# Patient Record
Sex: Female | Born: 1935 | Race: White | Hispanic: No | Marital: Married
Health system: Southern US, Community
[De-identification: ages and names within clinical notes are randomized; demographics above are authoritative.]

## PROBLEM LIST (undated history)

## (undated) HISTORY — PX: BREAST EXCISIONAL BIOPSY: SUR124

## (undated) HISTORY — PX: BREAST LUMPECTOMY: SHX2

---

## 2017-03-06 ENCOUNTER — Other Ambulatory Visit: Payer: Self-pay | Admitting: Family Medicine

## 2017-03-06 DIAGNOSIS — Z139 Encounter for screening, unspecified: Secondary | ICD-10-CM

## 2017-04-06 ENCOUNTER — Ambulatory Visit
Admission: RE | Admit: 2017-04-06 | Discharge: 2017-04-06 | Disposition: A | Discharge status: Home / Self Care | Payer: Medicare Other | Source: Ambulatory Visit | Attending: Family Medicine | Admitting: Family Medicine

## 2017-04-06 ENCOUNTER — Encounter: Payer: Self-pay | Admitting: Radiology

## 2017-04-06 DIAGNOSIS — Z139 Encounter for screening, unspecified: Secondary | ICD-10-CM

## 2017-10-05 ENCOUNTER — Emergency Department (HOSPITAL_COMMUNITY): Payer: Medicare Other | Admitting: Certified Registered Nurse Anesthetist

## 2017-10-05 ENCOUNTER — Encounter (HOSPITAL_COMMUNITY): Payer: Self-pay | Admitting: Neurology

## 2017-10-05 ENCOUNTER — Inpatient Hospital Stay (HOSPITAL_COMMUNITY)
Admission: EM | Admit: 2017-10-05 | Discharge: 2017-10-22 | DRG: 023 | Disposition: E | Payer: Medicare Other | Attending: Neurology | Admitting: Neurology

## 2017-10-05 ENCOUNTER — Emergency Department (HOSPITAL_COMMUNITY): Payer: Medicare Other

## 2017-10-05 ENCOUNTER — Encounter (HOSPITAL_COMMUNITY): Admission: EM | Disposition: E | Payer: Self-pay | Source: Home / Self Care | Attending: Neurology

## 2017-10-05 DIAGNOSIS — I63411 Cerebral infarction due to embolism of right middle cerebral artery: Secondary | ICD-10-CM | POA: Diagnosis present

## 2017-10-05 DIAGNOSIS — R680 Hypothermia, not associated with low environmental temperature: Secondary | ICD-10-CM | POA: Diagnosis not present

## 2017-10-05 DIAGNOSIS — G8194 Hemiplegia, unspecified affecting left nondominant side: Secondary | ICD-10-CM | POA: Diagnosis present

## 2017-10-05 DIAGNOSIS — R29718 NIHSS score 18: Secondary | ICD-10-CM | POA: Diagnosis present

## 2017-10-05 DIAGNOSIS — I6389 Other cerebral infarction: Secondary | ICD-10-CM | POA: Diagnosis not present

## 2017-10-05 DIAGNOSIS — R001 Bradycardia, unspecified: Secondary | ICD-10-CM | POA: Diagnosis not present

## 2017-10-05 DIAGNOSIS — F419 Anxiety disorder, unspecified: Secondary | ICD-10-CM | POA: Diagnosis present

## 2017-10-05 DIAGNOSIS — Z8673 Personal history of transient ischemic attack (TIA), and cerebral infarction without residual deficits: Secondary | ICD-10-CM

## 2017-10-05 DIAGNOSIS — J9601 Acute respiratory failure with hypoxia: Secondary | ICD-10-CM | POA: Diagnosis not present

## 2017-10-05 DIAGNOSIS — Z7189 Other specified counseling: Secondary | ICD-10-CM | POA: Diagnosis not present

## 2017-10-05 DIAGNOSIS — R739 Hyperglycemia, unspecified: Secondary | ICD-10-CM | POA: Diagnosis present

## 2017-10-05 DIAGNOSIS — Z66 Do not resuscitate: Secondary | ICD-10-CM | POA: Diagnosis not present

## 2017-10-05 DIAGNOSIS — I959 Hypotension, unspecified: Secondary | ICD-10-CM | POA: Diagnosis not present

## 2017-10-05 DIAGNOSIS — Z823 Family history of stroke: Secondary | ICD-10-CM | POA: Diagnosis not present

## 2017-10-05 DIAGNOSIS — R0902 Hypoxemia: Secondary | ICD-10-CM | POA: Diagnosis present

## 2017-10-05 DIAGNOSIS — I63432 Cerebral infarction due to embolism of left posterior cerebral artery: Secondary | ICD-10-CM | POA: Diagnosis present

## 2017-10-05 DIAGNOSIS — R0603 Acute respiratory distress: Secondary | ICD-10-CM | POA: Diagnosis present

## 2017-10-05 DIAGNOSIS — I6601 Occlusion and stenosis of right middle cerebral artery: Secondary | ICD-10-CM | POA: Diagnosis not present

## 2017-10-05 DIAGNOSIS — E876 Hypokalemia: Secondary | ICD-10-CM | POA: Diagnosis present

## 2017-10-05 DIAGNOSIS — Z6828 Body mass index (BMI) 28.0-28.9, adult: Secondary | ICD-10-CM

## 2017-10-05 DIAGNOSIS — R29810 Facial weakness: Secondary | ICD-10-CM | POA: Diagnosis present

## 2017-10-05 DIAGNOSIS — E871 Hypo-osmolality and hyponatremia: Secondary | ICD-10-CM | POA: Diagnosis present

## 2017-10-05 DIAGNOSIS — E785 Hyperlipidemia, unspecified: Secondary | ICD-10-CM | POA: Diagnosis present

## 2017-10-05 DIAGNOSIS — Z923 Personal history of irradiation: Secondary | ICD-10-CM | POA: Diagnosis not present

## 2017-10-05 DIAGNOSIS — I10 Essential (primary) hypertension: Secondary | ICD-10-CM | POA: Diagnosis present

## 2017-10-05 DIAGNOSIS — G936 Cerebral edema: Secondary | ICD-10-CM | POA: Diagnosis present

## 2017-10-05 DIAGNOSIS — Z853 Personal history of malignant neoplasm of breast: Secondary | ICD-10-CM

## 2017-10-05 DIAGNOSIS — I6521 Occlusion and stenosis of right carotid artery: Secondary | ICD-10-CM | POA: Diagnosis not present

## 2017-10-05 DIAGNOSIS — Z515 Encounter for palliative care: Secondary | ICD-10-CM | POA: Diagnosis not present

## 2017-10-05 DIAGNOSIS — D72829 Elevated white blood cell count, unspecified: Secondary | ICD-10-CM | POA: Diagnosis present

## 2017-10-05 DIAGNOSIS — E669 Obesity, unspecified: Secondary | ICD-10-CM | POA: Diagnosis present

## 2017-10-05 DIAGNOSIS — I340 Nonrheumatic mitral (valve) insufficiency: Secondary | ICD-10-CM | POA: Diagnosis not present

## 2017-10-05 DIAGNOSIS — G92 Toxic encephalopathy: Secondary | ICD-10-CM | POA: Diagnosis present

## 2017-10-05 DIAGNOSIS — Z79899 Other long term (current) drug therapy: Secondary | ICD-10-CM

## 2017-10-05 DIAGNOSIS — R1310 Dysphagia, unspecified: Secondary | ICD-10-CM | POA: Diagnosis present

## 2017-10-05 DIAGNOSIS — I639 Cerebral infarction, unspecified: Secondary | ICD-10-CM

## 2017-10-05 DIAGNOSIS — I952 Hypotension due to drugs: Secondary | ICD-10-CM | POA: Diagnosis not present

## 2017-10-05 DIAGNOSIS — Z7982 Long term (current) use of aspirin: Secondary | ICD-10-CM

## 2017-10-05 DIAGNOSIS — J9602 Acute respiratory failure with hypercapnia: Secondary | ICD-10-CM | POA: Diagnosis not present

## 2017-10-05 DIAGNOSIS — I361 Nonrheumatic tricuspid (valve) insufficiency: Secondary | ICD-10-CM | POA: Diagnosis not present

## 2017-10-05 DIAGNOSIS — Z9289 Personal history of other medical treatment: Secondary | ICD-10-CM

## 2017-10-05 DIAGNOSIS — J9690 Respiratory failure, unspecified, unspecified whether with hypoxia or hypercapnia: Secondary | ICD-10-CM | POA: Diagnosis not present

## 2017-10-05 HISTORY — PX: IR CT HEAD LTD: IMG2386

## 2017-10-05 HISTORY — PX: IR PERCUTANEOUS ART THROMBECTOMY/INFUSION INTRACRANIAL INC DIAG ANGIO: IMG6087

## 2017-10-05 HISTORY — PX: RADIOLOGY WITH ANESTHESIA: SHX6223

## 2017-10-05 LAB — I-STAT TROPONIN, ED: TROPONIN I, POC: 0.01 ng/mL (ref 0.00–0.08)

## 2017-10-05 LAB — CBC
HCT: 39.2 % (ref 36.0–46.0)
Hemoglobin: 12.9 g/dL (ref 12.0–15.0)
MCH: 22.2 pg — ABNORMAL LOW (ref 26.0–34.0)
MCHC: 32.9 g/dL (ref 30.0–36.0)
MCV: 67.4 fL — AB (ref 78.0–100.0)
PLATELETS: 365 10*3/uL (ref 150–400)
RBC: 5.82 MIL/uL — ABNORMAL HIGH (ref 3.87–5.11)
RDW: 21.4 % — AB (ref 11.5–15.5)
WBC: 14.5 10*3/uL — AB (ref 4.0–10.5)

## 2017-10-05 LAB — POCT I-STAT 3, ART BLOOD GAS (G3+)
Acid-base deficit: 3 mmol/L — ABNORMAL HIGH (ref 0.0–2.0)
Bicarbonate: 22.9 mmol/L (ref 20.0–28.0)
O2 Saturation: 98 %
PCO2 ART: 37.9 mmHg (ref 32.0–48.0)
TCO2: 24 mmol/L (ref 22–32)
pH, Arterial: 7.377 (ref 7.350–7.450)
pO2, Arterial: 91 mmHg (ref 83.0–108.0)

## 2017-10-05 LAB — DIFFERENTIAL
Basophils Absolute: 0 10*3/uL (ref 0.0–0.1)
Basophils Relative: 0 %
EOS ABS: 0 10*3/uL (ref 0.0–0.7)
Eosinophils Relative: 0 %
LYMPHS ABS: 1.3 10*3/uL (ref 0.7–4.0)
Lymphocytes Relative: 9 %
MONO ABS: 1 10*3/uL (ref 0.1–1.0)
Monocytes Relative: 7 %
Neutro Abs: 12.2 10*3/uL — ABNORMAL HIGH (ref 1.7–7.7)
Neutrophils Relative %: 84 %

## 2017-10-05 LAB — PROTIME-INR
INR: 1.04
PROTHROMBIN TIME: 13.5 s (ref 11.4–15.2)

## 2017-10-05 LAB — RENAL FUNCTION PANEL
ALBUMIN: 2.8 g/dL — AB (ref 3.5–5.0)
ANION GAP: 9 (ref 5–15)
BUN: 11 mg/dL (ref 8–23)
CALCIUM: 7 mg/dL — AB (ref 8.9–10.3)
CO2: 23 mmol/L (ref 22–32)
CREATININE: 0.53 mg/dL (ref 0.44–1.00)
Chloride: 99 mmol/L (ref 98–111)
GFR calc Af Amer: >60 mL/min (ref >60)
GFR calc non Af Amer: >60 mL/min (ref >60)
GLUCOSE: 113 mg/dL — AB (ref 70–99)
PHOSPHORUS: 2.8 mg/dL (ref 2.5–4.6)
Potassium: 2.4 mmol/L — CL (ref 3.5–5.1)
SODIUM: 131 mmol/L — AB (ref 135–145)

## 2017-10-05 LAB — COMPREHENSIVE METABOLIC PANEL
ALT: 19 U/L (ref 0–44)
ANION GAP: 13 (ref 5–15)
AST: 23 U/L (ref 15–41)
Albumin: 4.2 g/dL (ref 3.5–5.0)
Alkaline Phosphatase: 62 U/L (ref 38–126)
BUN: 15 mg/dL (ref 8–23)
CHLORIDE: 91 mmol/L — AB (ref 98–111)
CO2: 25 mmol/L (ref 22–32)
Calcium: 9.5 mg/dL (ref 8.9–10.3)
Creatinine, Ser: 0.64 mg/dL (ref 0.44–1.00)
GFR calc non Af Amer: >60 mL/min (ref >60)
Glucose, Bld: 125 mg/dL — ABNORMAL HIGH (ref 70–99)
POTASSIUM: 3 mmol/L — AB (ref 3.5–5.1)
Sodium: 129 mmol/L — ABNORMAL LOW (ref 135–145)
Total Bilirubin: 1.7 mg/dL — ABNORMAL HIGH (ref 0.3–1.2)
Total Protein: 7.6 g/dL (ref 6.5–8.1)

## 2017-10-05 LAB — PROCALCITONIN: Procalcitonin: <0.1 ng/mL

## 2017-10-05 LAB — I-STAT CHEM 8, ED
BUN: 16 mg/dL (ref 8–23)
CALCIUM ION: 1.03 mmol/L — AB (ref 1.15–1.40)
Chloride: 91 mmol/L — ABNORMAL LOW (ref 98–111)
Creatinine, Ser: 0.5 mg/dL (ref 0.44–1.00)
Glucose, Bld: 125 mg/dL — ABNORMAL HIGH (ref 70–99)
HCT: 45 % (ref 36.0–46.0)
HEMOGLOBIN: 15.3 g/dL — AB (ref 12.0–15.0)
POTASSIUM: 3 mmol/L — AB (ref 3.5–5.1)
SODIUM: 128 mmol/L — AB (ref 135–145)
TCO2: 27 mmol/L (ref 22–32)

## 2017-10-05 LAB — GLUCOSE, CAPILLARY
GLUCOSE-CAPILLARY: 106 mg/dL — AB (ref 70–99)
GLUCOSE-CAPILLARY: 88 mg/dL (ref 70–99)

## 2017-10-05 LAB — TSH: TSH: 3.614 u[IU]/mL (ref 0.350–4.500)

## 2017-10-05 LAB — CBG MONITORING, ED: GLUCOSE-CAPILLARY: 132 mg/dL — AB (ref 70–99)

## 2017-10-05 LAB — APTT: APTT: 31 s (ref 24–36)

## 2017-10-05 LAB — TRIGLYCERIDES: TRIGLYCERIDES: 74 mg/dL (ref <150)

## 2017-10-05 LAB — MAGNESIUM: Magnesium: 1.3 mg/dL — ABNORMAL LOW (ref 1.7–2.4)

## 2017-10-05 LAB — ETHANOL

## 2017-10-05 LAB — MRSA PCR SCREENING: MRSA BY PCR: NEGATIVE

## 2017-10-05 LAB — BRAIN NATRIURETIC PEPTIDE: B NATRIURETIC PEPTIDE 5: 186.4 pg/mL — AB (ref 0.0–100.0)

## 2017-10-05 SURGERY — IR WITH ANESTHESIA
Anesthesia: General

## 2017-10-05 MED ORDER — SODIUM CHLORIDE 0.9 % IV SOLN
0.0000 ug/min | INTRAVENOUS | Status: DC
  Administered 2017-10-05: 30 ug/min via INTRAVENOUS
  Filled 2017-10-05: qty 10

## 2017-10-05 MED ORDER — CHLORHEXIDINE GLUCONATE 0.12% ORAL RINSE (MEDLINE KIT)
15.0000 mL | Freq: Two times a day (BID) | OROMUCOSAL | Status: DC
  Administered 2017-10-05 – 2017-10-07 (×5): 15 mL via OROMUCOSAL

## 2017-10-05 MED ORDER — ETOMIDATE 2 MG/ML IV SOLN
INTRAVENOUS | Status: DC | PRN
  Administered 2017-10-05: 20 mg via INTRAVENOUS

## 2017-10-05 MED ORDER — ROCURONIUM BROMIDE 50 MG/5ML IV SOLN
INTRAVENOUS | Status: DC | PRN
  Administered 2017-10-05: 100 mg via INTRAVENOUS

## 2017-10-05 MED ORDER — FENTANYL CITRATE (PF) 100 MCG/2ML IJ SOLN
INTRAMUSCULAR | Status: DC | PRN
  Administered 2017-10-05: 50 ug via INTRAVENOUS

## 2017-10-05 MED ORDER — SODIUM CHLORIDE 0.9 % IV SOLN
INTRAVENOUS | Status: DC
  Administered 2017-10-05 – 2017-10-07 (×3): via INTRAVENOUS

## 2017-10-05 MED ORDER — SODIUM CHLORIDE 0.9 % IV SOLN
INTRAVENOUS | Status: DC | PRN
  Administered 2017-10-05: 20 ug/min via INTRAVENOUS

## 2017-10-05 MED ORDER — ROCURONIUM BROMIDE 100 MG/10ML IV SOLN
INTRAVENOUS | Status: DC | PRN
  Administered 2017-10-05: 40 mg via INTRAVENOUS

## 2017-10-05 MED ORDER — SODIUM CHLORIDE 0.9 % IV SOLN
1.5000 g | Freq: Four times a day (QID) | INTRAVENOUS | Status: DC
  Administered 2017-10-05 – 2017-10-07 (×7): 1.5 g via INTRAVENOUS
  Filled 2017-10-05 (×8): qty 1.5

## 2017-10-05 MED ORDER — TICAGRELOR 90 MG PO TABS
ORAL_TABLET | ORAL | Status: AC
  Filled 2017-10-05: qty 2

## 2017-10-05 MED ORDER — CEFAZOLIN SODIUM-DEXTROSE 2-3 GM-%(50ML) IV SOLR
INTRAVENOUS | Status: DC | PRN
  Administered 2017-10-05: 2 g via INTRAVENOUS

## 2017-10-05 MED ORDER — LIDOCAINE HCL 1 % IJ SOLN
INTRAMUSCULAR | Status: AC
  Filled 2017-10-05: qty 20

## 2017-10-05 MED ORDER — EPHEDRINE SULFATE 50 MG/ML IJ SOLN
INTRAMUSCULAR | Status: DC | PRN
  Administered 2017-10-05 (×3): 10 mg via INTRAVENOUS

## 2017-10-05 MED ORDER — IOPAMIDOL (ISOVUE-370) INJECTION 76%
90.0000 mL | Freq: Once | INTRAVENOUS | Status: AC | PRN
  Administered 2017-10-05: 90 mL via INTRAVENOUS

## 2017-10-05 MED ORDER — ACETAMINOPHEN 325 MG PO TABS: 650.0000 mg | ORAL_TABLET | ORAL | Status: DC | PRN

## 2017-10-05 MED ORDER — ACETAMINOPHEN 160 MG/5ML PO SOLN: 650.0000 mg | ORAL | Status: DC | PRN

## 2017-10-05 MED ORDER — PROPOFOL 1000 MG/100ML IV EMUL
INTRAVENOUS | Status: AC
  Filled 2017-10-05: qty 100

## 2017-10-05 MED ORDER — PHENYLEPHRINE HCL-NACL 10-0.9 MG/250ML-% IV SOLN
0.0000 ug/min | INTRAVENOUS | Status: DC
  Filled 2017-10-05: qty 250

## 2017-10-05 MED ORDER — NITROGLYCERIN 1 MG/10 ML FOR IR/CATH LAB
INTRA_ARTERIAL | Status: AC
  Filled 2017-10-05: qty 10

## 2017-10-05 MED ORDER — IOHEXOL 300 MG/ML  SOLN: 280.0000 mL | Freq: Once | INTRAMUSCULAR | Status: DC | PRN

## 2017-10-05 MED ORDER — EPTIFIBATIDE 20 MG/10ML IV SOLN
INTRAVENOUS | Status: AC
  Filled 2017-10-05: qty 10

## 2017-10-05 MED ORDER — FENTANYL CITRATE (PF) 100 MCG/2ML IJ SOLN
50.0000 ug | INTRAMUSCULAR | Status: DC | PRN
  Administered 2017-10-07: 50 ug via INTRAVENOUS
  Filled 2017-10-05: qty 2

## 2017-10-05 MED ORDER — NICARDIPINE HCL IN NACL 20-0.86 MG/200ML-% IV SOLN
0.0000 mg/h | INTRAVENOUS | Status: DC
  Filled 2017-10-05: qty 200

## 2017-10-05 MED ORDER — ACETAMINOPHEN 650 MG RE SUPP: 650.0000 mg | RECTAL | Status: DC | PRN

## 2017-10-05 MED ORDER — STROKE: EARLY STAGES OF RECOVERY BOOK
Freq: Once | Status: DC
  Filled 2017-10-05: qty 1

## 2017-10-05 MED ORDER — NITROGLYCERIN 1 MG/10 ML FOR IR/CATH LAB
INTRA_ARTERIAL | Status: DC | PRN
  Administered 2017-10-05: 25 ug via INTRA_ARTERIAL

## 2017-10-05 MED ORDER — LORAZEPAM 2 MG/ML IJ SOLN
INTRAMUSCULAR | Status: AC
  Administered 2017-10-05: 2 mg
  Filled 2017-10-05: qty 1

## 2017-10-05 MED ORDER — ALBUTEROL SULFATE (2.5 MG/3ML) 0.083% IN NEBU: 2.5000 mg | INHALATION_SOLUTION | RESPIRATORY_TRACT | Status: DC | PRN

## 2017-10-05 MED ORDER — ASPIRIN 81 MG PO CHEW
CHEWABLE_TABLET | ORAL | Status: AC
  Filled 2017-10-05: qty 1

## 2017-10-05 MED ORDER — EPTIFIBATIDE 20 MG/10ML IV SOLN
INTRAVENOUS | Status: DC | PRN
  Administered 2017-10-05 (×2): 1.5 mg

## 2017-10-05 MED ORDER — ACETAMINOPHEN 160 MG/5ML PO SOLN
650.0000 mg | ORAL | Status: DC | PRN
  Administered 2017-10-06 – 2017-10-07 (×3): 650 mg
  Filled 2017-10-05 (×3): qty 20.3

## 2017-10-05 MED ORDER — CEFAZOLIN SODIUM-DEXTROSE 2-4 GM/100ML-% IV SOLN
INTRAVENOUS | Status: AC
  Filled 2017-10-05: qty 100

## 2017-10-05 MED ORDER — TIROFIBAN HCL IN NACL 5-0.9 MG/100ML-% IV SOLN
INTRAVENOUS | Status: AC
  Filled 2017-10-05: qty 100

## 2017-10-05 MED ORDER — INSULIN ASPART 100 UNIT/ML ~~LOC~~ SOLN: 0.0000 [IU] | SUBCUTANEOUS | Status: DC

## 2017-10-05 MED ORDER — GLYCOPYRROLATE 0.2 MG/ML IJ SOLN
INTRAMUSCULAR | Status: DC | PRN
  Administered 2017-10-05: 0.2 mg via INTRAVENOUS

## 2017-10-05 MED ORDER — SENNOSIDES-DOCUSATE SODIUM 8.6-50 MG PO TABS: 1.0000 | ORAL_TABLET | Freq: Every evening | ORAL | Status: DC | PRN

## 2017-10-05 MED ORDER — PROPOFOL 1000 MG/100ML IV EMUL
5.0000 ug/kg/min | Freq: Once | INTRAVENOUS | Status: AC
  Administered 2017-10-05: 5 ug/kg/min via INTRAVENOUS

## 2017-10-05 MED ORDER — ORAL CARE MOUTH RINSE
15.0000 mL | OROMUCOSAL | Status: DC
  Administered 2017-10-05 – 2017-10-08 (×23): 15 mL via OROMUCOSAL

## 2017-10-05 MED ORDER — SODIUM CHLORIDE 0.9 % IV SOLN
1.5000 g | Freq: Four times a day (QID) | INTRAVENOUS | Status: DC
  Filled 2017-10-05 (×3): qty 1.5

## 2017-10-05 MED ORDER — LORAZEPAM 2 MG/ML IJ SOLN
INTRAMUSCULAR | Status: AC
  Administered 2017-10-05: 11:00:00
  Filled 2017-10-05: qty 1

## 2017-10-05 MED ORDER — SODIUM CHLORIDE 0.9 % IV SOLN
INTRAVENOUS | Status: DC
  Administered 2017-10-05 – 2017-10-07 (×3): via INTRAVENOUS

## 2017-10-05 MED ORDER — ASPIRIN EC 81 MG PO TBEC
DELAYED_RELEASE_TABLET | ORAL | Status: DC | PRN
  Administered 2017-10-05: 81 mg via ORAL

## 2017-10-05 MED ORDER — FENTANYL CITRATE (PF) 100 MCG/2ML IJ SOLN: 50.0000 ug | INTRAMUSCULAR | Status: DC | PRN

## 2017-10-05 MED ORDER — TICAGRELOR 60 MG PO TABS
ORAL_TABLET | ORAL | Status: DC | PRN
  Administered 2017-10-05: 180 mg via ORAL

## 2017-10-05 MED ORDER — ASPIRIN 325 MG PO TABS
ORAL_TABLET | ORAL | Status: AC
  Filled 2017-10-05: qty 1

## 2017-10-05 MED ORDER — POTASSIUM CHLORIDE 10 MEQ/100ML IV SOLN
10.0000 meq | INTRAVENOUS | Status: AC
  Administered 2017-10-05 (×4): 10 meq via INTRAVENOUS
  Filled 2017-10-05 (×4): qty 100

## 2017-10-05 MED ORDER — SODIUM CHLORIDE 0.9 % IV SOLN
INTRAVENOUS | Status: DC
  Administered 2017-10-05 (×2): via INTRAVENOUS

## 2017-10-05 MED ORDER — BISACODYL 10 MG RE SUPP: 10.0000 mg | Freq: Every day | RECTAL | Status: DC | PRN

## 2017-10-05 MED ORDER — CLEVIDIPINE BUTYRATE 0.5 MG/ML IV EMUL
0.0000 mg/h | INTRAVENOUS | Status: DC
  Administered 2017-10-06 – 2017-10-07 (×2): 1 mg/h via INTRAVENOUS
  Filled 2017-10-05 (×2): qty 50

## 2017-10-05 MED ORDER — FAMOTIDINE IN NACL 20-0.9 MG/50ML-% IV SOLN
20.0000 mg | INTRAVENOUS | Status: DC
  Administered 2017-10-05 – 2017-10-06 (×2): 20 mg via INTRAVENOUS
  Filled 2017-10-05 (×3): qty 50

## 2017-10-05 MED ORDER — FENTANYL CITRATE (PF) 100 MCG/2ML IJ SOLN
INTRAMUSCULAR | Status: AC
  Filled 2017-10-05: qty 4

## 2017-10-05 MED ORDER — CLOPIDOGREL BISULFATE 300 MG PO TABS
ORAL_TABLET | ORAL | Status: AC
  Filled 2017-10-05: qty 1

## 2017-10-05 MED ORDER — DOCUSATE SODIUM 50 MG/5ML PO LIQD: 100.0000 mg | Freq: Two times a day (BID) | ORAL | Status: DC | PRN

## 2017-10-05 MED ORDER — PROPOFOL 1000 MG/100ML IV EMUL
0.0000 ug/kg/min | INTRAVENOUS | Status: DC
  Administered 2017-10-05: 15 ug/kg/min via INTRAVENOUS
  Administered 2017-10-06 (×2): 40 ug/kg/min via INTRAVENOUS
  Administered 2017-10-06: 30 ug/kg/min via INTRAVENOUS
  Administered 2017-10-06: 50 ug/kg/min via INTRAVENOUS
  Administered 2017-10-07: 30 ug/kg/min via INTRAVENOUS
  Filled 2017-10-05 (×7): qty 100

## 2017-10-05 NOTE — Code Documentation (Signed)
84 yo female coming from home with her family with complaints of sudden onset of being unresponsive. EMS was called and EMS stated they activated a Code Stroke. Code Stroke was not activated until patient was arrived. Stroke Team met patient right after arrival upon notification of the patient being present. Pt was taken to CT. CT Head negative. CTA/CTP showed an occlusion. Unknown of LVO so patient was brought to MRI. Patient woke up this morning "Anxious" per family. No tPA due to [facility] last night when she went to bed. Family reports that she went to bed at baseline. Woke up this morning anxious, got herself dressed, and went to the bathroom. Family checked on patient in the bathroom. She was still sitting on the toilet, but she could not speak to them at 0915. Initial NIHSS 23 due to patient not beind alert, cannot answer questions, cannot follow commands, slight facial droop, weakness in left arm and left leg, mute, and dysarthric. Oxygen noted to be 82% on RA. Pt placed on 4L and oxygen increase to 90%. Pt delayed with decision for IR due to MRI. Discussion between IR and Neurology in progress. Pt continued to be monitored. MRI complete. Danelle Berry, RN at the bedside with patient.

## 2017-10-05 NOTE — Sedation Documentation (Signed)
Right groin/pulses checked with Selena LesserSuzanna, RN, ICU.

## 2017-10-05 NOTE — Progress Notes (Signed)
Pharmacy Antibiotic Note  Kathryn Johnson is a 84 y.o. female admitted on 04/12/2017 with pneumonia.  Pharmacy has been consulted for Unasyn dosing. CXR with bilateral opacities. WBC elevated at 14.5. SCr 0.5.   Plan: -Unasyn 1.5 gm IV Q 6 hours -Monitor CBC, renal fx, cultures and clinical progress  Height: 5\' 4"  (162.6 cm) Weight: 165 lb 5.5 oz (75 kg) IBW/kg (Calculated) : 54.7  Temp (24hrs), Avg:94.5 F (34.7 C), Min:93.2 F (34 C), Max:97.5 F (36.4 C)  Recent Labs  Lab 02-11-18 1011 02-11-18 1017  WBC 14.5*  --   CREATININE 0.64 0.50    Estimated Creatinine Clearance: 51.9 mL/min (by C-G formula based on SCr of 0.5 mg/dL).    No Known Allergies  Antimicrobials this admission: Unasyn 7/15 >>   Dose adjustments this admission: None   Microbiology results:   Thank you for allowing pharmacy to be a part of this patient's care.  Vinnie LevelBenjamin Lakai Moree, PharmD., BCPS Clinical Pharmacist Clinical phone for September 21, 2017 until 3:30pm: [phone] If after 3:30pm, [facility] for unit-specific pharmacist

## 2017-10-05 NOTE — ED Notes (Signed)
Very difficult getting scans at this time. MD Wilford CornerArora aware and has verbally ordered 1 mg ativan IV.

## 2017-10-05 NOTE — Progress Notes (Signed)
Pt is at MRI at the moment - spoke w Felicie Mornavid Smith, we will get this patient when he is back.

## 2017-10-05 NOTE — Anesthesia Procedure Notes (Signed)
Arterial Line Insertion Start/End10-27-19 12:22 PM, 01-17-18 12:33 PM Performed by: Ramey Schiff T, Scientist, clinical (histocompatibility and immunogenetics)CRNA, CRNA  Patient location: OOR procedure area. Preanesthetic checklist: patient identified, IV checked, site marked, surgical consent, monitors and equipment checked and pre-op evaluation Patient sedated Left, radial was placed Catheter size: 20 G Hand hygiene performed   Attempts: 2 Procedure performed without using ultrasound guided technique. Following insertion, dressing applied and Biopatch. Post procedure assessment: normal  Patient tolerated the procedure well with no immediate complications.

## 2017-10-05 NOTE — ED Notes (Signed)
Received additional order for IV ativan 1 mg due to patient continued altered mental status resulting in inability to complete imaging.

## 2017-10-05 NOTE — ED Notes (Signed)
Pt requiring supplemental O2 for sats in 80's.

## 2017-10-05 NOTE — ED Provider Notes (Signed)
[name] EMERGENCY DEPARTMENT Provider Note   CSN: [id number] Arrival date & time: 09/30/2017  1006     History   Chief Complaint No chief complaint on file.   HPI Kathryn Johnson is a 84 y.o. female hx of arthritis here presenting with code stroke.  She woke up this morning around 915 and was doing well.  Family left and came back home and noticed that she was on the floor sitting on the toilet staring forward and had to defecate on herself.  EMS was called and code stroke was activated.  Was noted to have left-sided weakness per EMS.  She has no previous history of stroke.  The history is provided by a relative and the EMS personnel.   Level V caveat- AMS   History reviewed. No pertinent past medical history.  There are no active problems to display for this patient.    OB History   None      Home Medications    Prior to Admission medications   Not on File    Family History Family History  Problem Relation Age of Onset  . Aneurysm Mother   . Heart disease Brother     Social History Social History   Tobacco Use  . Smoking status: Not on file  Substance Use Topics  . Alcohol use: Not on file  . Drug use: Not on file     Allergies   Patient has no allergy information on record.   Review of Systems Review of Systems  Neurological: Positive for speech difficulty and weakness.  All other systems reviewed and are negative.    Physical Exam Updated Vital Signs There were no vitals taken for this visit.  Physical Exam  Constitutional:  Ill appearing, obvious L facial droop, non verbal   HENT:  Head: Normocephalic.  Mouth/Throat: Oropharynx is clear and moist.  Eyes: Pupils are equal, round, and reactive to light. Conjunctivae and EOM are normal.  Neck: Normal range of motion. Neck supple.  Cardiovascular: Normal rate, regular rhythm and normal heart sounds.  Pulmonary/Chest: Effort normal and breath sounds normal. No stridor.  No respiratory distress. She has no wheezes.  Abdominal: Soft. Bowel sounds are normal. She exhibits no distension. There is no tenderness. There is no guarding.  Musculoskeletal: Normal range of motion.  Neurological:  Difficult to arouse, respond to pain on R side. Flaccid on the L arm and legs.   Skin: Skin is warm.  Psychiatric:  unable  Nursing note and vitals reviewed.    ED Treatments / Results  Labs (all labs ordered are listed, but only abnormal results are displayed) Labs Reviewed  CBC - Abnormal; Notable for the following components:      Result Value   WBC 14.5 (*)    RBC 5.82 (*)    MCV 67.4 (*)    MCH 22.2 (*)    RDW 21.4 (*)    All other components within normal limits  COMPREHENSIVE METABOLIC PANEL - Abnormal; Notable for the following components:   Sodium 129 (*)    Potassium 3.0 (*)    Chloride 91 (*)    Glucose, Bld 125 (*)    Total Bilirubin 1.7 (*)    All other components within normal limits  CBG MONITORING, ED - Abnormal; Notable for the following components:   Glucose-Capillary 132 (*)    All other components within normal limits  I-STAT CHEM 8, ED - Abnormal; Notable for the following components:   Sodium  128 (*)    Potassium 3.0 (*)    Chloride 91 (*)    Glucose, Bld 125 (*)    Calcium, Ion 1.03 (*)    Hemoglobin 15.3 (*)    All other components within normal limits  ETHANOL  PROTIME-INR  APTT  DIFFERENTIAL  RAPID URINE DRUG SCREEN, HOSP PERFORMED  URINALYSIS, ROUTINE W REFLEX MICROSCOPIC  BLOOD GAS, ARTERIAL  I-STAT TROPONIN, ED    EKG None  Radiology Ct Head Code Stroke Wo Contrast  Result Date: 30-Oct-2017 CLINICAL DATA:  Code stroke. 84 year old female found unresponsive with gaze deviation. Family denies knowledge of a previous stroke. EXAM: CT HEAD WITHOUT CONTRAST TECHNIQUE: Contiguous axial images were obtained from the base of the skull through the vertex without intravenous contrast. COMPARISON:  No prior head CT. FINDINGS:  Brain: There is a 3-4 centimeter area of hypodensity in the right MCA territory centered at the insula and operculum which has a chronic appearance. Some of this demonstrates dystrophic calcification. There is no associated mass effect. There is a smaller area of cortical hypodensity along the posterior left sylvian fissure which also has a chronic appearance (sagittal image 43). No associated mass effect. No definite acute cytotoxic edema identified. No intracranial hemorrhage or mass effect. No other cortical encephalomalacia identified. No ventriculomegaly. Patent basilar cisterns. Vascular: Calcified atherosclerosis at the skull base. No suspicious intracranial vascular hyperdensity. Skull: Intermittent motion artifact. No acute osseous abnormality identified. Sinuses/Orbits: Visualized paranasal sinuses and mastoids are well pneumatized. Other: Gaze appears to be midline. Postoperative changes to both globes. No acute scalp soft tissue findings. ASPECTS [facility] Stroke Program Early CT Score) Total score (0-10 with 10 being normal): 10, allowing for bilateral MCA territory chronic appearing encephalomalacia. IMPRESSION: 1. Prior right greater than left MCA territory ischemia which is favored to be chronic. No intracranial hemorrhage, intracranial mass effect, or definite acute cytotoxic edema. 2. This was discussed by telephone with Dr. Wilford Corner on October 30, 2017 at 1055 hours. Electronically Signed   By: Odessa Fleming M.D.   On: 2017/10/30 10:59    Procedures Procedures (including critical care time)  CRITICAL CARE Performed by: Richardean Canal   Total critical care time: 40 minutes  Critical care time was exclusive of separately billable procedures and treating other patients.  Critical care was necessary to treat or prevent imminent or life-threatening deterioration.  Critical care was time spent personally by me on the following activities: development of treatment plan with patient and/or surrogate as well  as nursing, discussions with consultants, evaluation of patient's response to treatment, examination of patient, obtaining history from patient or surrogate, ordering and performing treatments and interventions, ordering and review of laboratory studies, ordering and review of radiographic studies, pulse oximetry and re-evaluation of patient's condition.   INTUBATION Performed by: Richardean Canal  Required items: required blood products, implants, devices, and special equipment available Patient identity confirmed: provided demographic data and hospital-assigned identification number Time out: Immediately prior to procedure a "time out" was called to verify the correct patient, procedure, equipment, support staff and site/side marked as required.  Indications: hypoxia, respiratory distress  Intubation method: Glidescope Laryngoscopy   Preoxygenation: BVM  Sedatives: 20 mg Etomidate Paralytic: 100 mg rocuronium   Tube Size: 7.5 cuffed  Post-procedure assessment: chest rise and ETCO2 monitor Breath sounds: equal and absent over the epigastrium Tube secured with: ETT holder Chest x-ray interpreted by radiologist and me.  Chest x-ray findings: endotracheal tube in appropriate position  Patient tolerated the procedure well with no  immediate complications.     Medications Ordered in ED Medications  LORazepam (ATIVAN) 2 MG/ML injection (has no administration in time range)  LORazepam (ATIVAN) 2 MG/ML injection (has no administration in time range)  iopamidol (ISOVUE-370) 76 % injection 90 mL (90 mLs Intravenous Contrast Given Feb 10, 2018 1042)     Initial Impression / Assessment and Plan / ED Course  I have reviewed the triage vital signs and the nursing notes.  Pertinent labs & imaging results that were available during my care of the patient were reviewed by me and considered in my medical decision making (see chart for details).    Jackqulyn LivingsMarion Delange is a 84 y.o. female here with L  sided weakness. Patient appears flaccid on the L side. Poor mental status but maintaining her airway. Code stroke activated by EMS. Dr. Jerrell BelfastAurora from neurology at bedside. Will get CT head, stroke labs.   11 AM CT head showed large R M1 occlusion, confirmed on CTA. Dr. Jerrell BelfastAurora discussed with family extensively regarding TPA vs IR TPA. The decision was to get IR TPA and admit to neuro ICU.   12:09 PM MRI showed bilateral infarcts. She has episodes of apnea and does desat to 85% despite being on 5 L [place]. I talked to Dr. Jerrell BelfastAurora and decided to intubate for hypoxic respiratory arrest likely from large stroke. I called ICU to admit. Neuro still wants to bring patient to IR for angiography.    Final Clinical Impressions(s) / ED Diagnoses   Final diagnoses:  None    ED Discharge Orders    None       Charlynne PanderYao, Jamarie Joplin Hsienta, MD 0Nov 20, 2019 1211

## 2017-10-05 NOTE — Consult Note (Addendum)
PULMONARY / CRITICAL CARE MEDICINE   Name: Kathryn Johnson MRN: [id number] DOB: [birth year]    ADMISSION DATE:  10-29-17 CONSULTATION DATE:  10-29-17  REFERRING MD:  Dr. Wilford Corner  CHIEF COMPLAINT:  AMS  HISTORY OF PRESENT ILLNESS:   HPI obtained from medical chart review as patient is intubated and sedated on mechanical ventilation.  No family present at time of evaluation.  84 year old female with PMH significant for HTN and anxiety, who presented for altered mental status and left sided weakness.   Initially thought LSW was today at 0915 when patient awoke, however family later clarified that she woke up anxious and went to bed normal around 2200.  Family found patient sitting on toilet staring forward and noted to have defecated on herself.  She presented as a code stroke.  CTA head and neck showed right ICA occlusion age-indeterminate and right MCA occlusion. CT perfusion was motion degraded, unable to   Taken for MRI brain which showed a large right hemispherical stroke in the right MCA and right PCA territory along with a left MCA/ PCA watershed and left PCA stroke.  Patient taken for diagnostic cerebral angiogram under general anesthesia where revascularization was completed to the right ICA and right PCA.  Returns to ICU on mechanical ventilation.  PCCM consulted for further vent management.   Addendum- Spoke with Daughter in whom patient lives with.  Reports hx of HTN, osteoarthritis, and prior breast cancer ~15 years treated with radiation and lymph removal therefore restricted on R arm.  Reports patient was in her normal state of health which is alert, oriented, uses a cane/walker due to prior hip and knee surgeries.  Complained of some abdominal discomfort 2 days ago due to a salad she ate but otherwise no issues.     PAST MEDICAL HISTORY :  She  has no past medical history on file.  PAST SURGICAL HISTORY: She  has a past surgical history that includes Breast excisional biopsy  (Right) and Breast lumpectomy (Right).  No Known Allergies  No current facility-administered medications on file prior to encounter.    No current outpatient medications on file prior to encounter.    FAMILY HISTORY:  Her family history includes Aneurysm in her mother; Heart disease in her brother.  SOCIAL HISTORY:     REVIEW OF SYSTEMS:   Unable to assess as patient is intubated and sedated on mechanical ventilation.  SUBJECTIVE:  Arrived on unit on propofol 50 mcg/kg/min and neosynephrine at 30 mcg/min  VITAL SIGNS: BP (!) 145/71   Pulse 93   Temp (!) 97.5 F (36.4 C) (Axillary)   Resp 14   Ht 5\' 4"  (1.626 m)   Wt 162 lb 4.1 oz (73.6 kg)   SpO2 100%   BMI 27.85 kg/m   HEMODYNAMICS:    VENTILATOR SETTINGS: Vent Mode: PRVC FiO2 (%):  [100 %] 100 % Set Rate:  [14 bmp] 14 bmp Vt Set:  [440 mL] 440 mL PEEP:  [5 cmH20] 5 cmH20 Plateau Pressure:  [18 cmH20] 18 cmH20  INTAKE / OUTPUT: No intake/output data recorded.  PHYSICAL EXAMINATION: General:  Critically ill, appears younger than documented age, female sedated on MV in NAD HEENT: MM pink/moist, pupils 3/reactive, anicteric, ETT 23 at lip/ OGT (partialy dislodged) Neuro: sedated on propofol, no response to pain CV: SB 40-50's, rrr, no m/r/g, R femoral site soft, dressing dry, +dps PULM: even/non-labored on MV,  lungs bilaterally clear throughout GI: soft, ND,  bs active  Extremities: cool /dry,  no edema  Skin: no rashes  LABS:  BMET Recent Labs  Lab 2017-07-10 1011 2017-07-10 1017  NA 129* 128*  K 3.0* 3.0*  CL 91* 91*  CO2 25  --   BUN 15 16  CREATININE 0.64 0.50  GLUCOSE 125* 125*    Electrolytes Recent Labs  Lab 2017-07-10 1011  CALCIUM 9.5    CBC Recent Labs  Lab 2017-07-10 1011 2017-07-10 1017  WBC 14.5*  --   HGB 12.9 15.3*  HCT 39.2 45.0  PLT 365  --     Coag's Recent Labs  Lab 2017-07-10 1011  APTT 31  INR 1.04    Sepsis Markers No results for input(s): LATICACIDVEN,  PROCALCITON, O2SATVEN in the last 168 hours.  ABG No results for input(s): PHART, PCO2ART, PO2ART in the last 168 hours.  Liver Enzymes Recent Labs  Lab 2017-07-10 1011  AST 23  ALT 19  ALKPHOS 62  BILITOT 1.7*  ALBUMIN 4.2    Cardiac Enzymes No results for input(s): TROPONINI, PROBNP in the last 168 hours.  Glucose Recent Labs  Lab 2017-07-10 1010  GLUCAP 132*    STUDIES:  7/15 CTH >> Prior right greater than left MCA territory ischemia which is favored to be chronic. No intracranial hemorrhage, intracranial mass effect, or definite acute cytotoxic edema.  7/15 CTA head/ neck / perfusion study >> 1. Positive for large vessel occlusion of the right ICA, and also the right MCA bifurcation. - furthermore, there is partial occlusion of the right Pcomm which constitutes a fetal type right PCA origin. The right PCA remains patent.  - there is collateral flow to the right M1 via the anterior communicating artery and ACAs. 2. Study discussed by telephone with Dr. Milon DikesASHISH ARORA on Jul 04, 2017 at 1055 hours, and again at 11:17.  The CT perfusion analysis is likely unreliable due to motion resulting in omission of some of the arterial input phase. We discussed that in light of the above CTA findings, I suspect the 24 mL core infarct volume in the right hemisphere may be fairly accurate, with the right occipital lobe involvement explained by the fetal right PCA findings above.  Probably then, much of the remaining right hemisphere is at risk, aside from that affected by what I suspect is a chronic right operculum infarct.  Dr. Wilford CornerArora will attempt a stat DWI only MRI scan for confirmation, with NIR intervention pending.  3. No superimposed hemodynamically significant stenosis in the left carotid or vertebrobasilar system. The right vertebral is dominant and supplies the basilar.  7/15 MRI brain >> 1. Right MCA and MCA/PCA watershed territory restricted diffusion suggesting core infarct beyond  that estimated on the CTP earlier today.  This ischemia appears concordant with the CTA findings of right ICA, right MCA bifurcation and right Pcomm origin occlusion. 2. Additional contralateral Left PCA and MCA PCA watershed territory restricted diffusion. On review of the earlier CTA, there is a paucity of flow in the distal left P3 branches concordant with this finding, although the left P1, P2 segments and left PCA bifurcation appear normal. 3. This study discussed by telephone with Dr. Wilford CornerArora on beginning at 1127 hours.  CXR 7/15 >> Borderline heart size. Perihilar and lower lobe airspace opacities bilaterally could reflect edema or infection. Endotracheal tube 6 cm above the carina.  TTE >>  CULTURES: 7/15 MRSA PCR >>  ANTIBIOTICS: none  SIGNIFICANT EVENTS: 7/15 Admitted/ IR  LINES/TUBES: PIV  7/15 ETT >> 7/15 OGT >> 7/15 foley >> 7/15 left  radial Aline >>  DISCUSSION: 89 yoF with PMH of HTN, possible an  ASSESSMENT / PLAN:  PULMONARY A: Acute respiratory insufficiency  R/o Aspiration pneumonia- prehilar and BLL opacities vs edema P:   Full MV support, PRVC 8 cc/kg, rate 16 ABG in 30 mins   CXR in am VAP bundle Daily SBT starting in am Will need SLP prior to PO once extubated Pepcid for SUP See ID   CARDIOVASCULAR A:  Prolonged QTc - 0.501 Bradycardia - likely in the setting of hypothermia (93.5 currently)/ sedation Hx HTN - troponin/ EKG  P:  Monitor tele / trend QTc daily  SBP goal 110 - 140 per Neuro IR Neo as needed prn  Avoid QTc prolonging measures  Continue aline Assess BNP TTE pending Assess lipid panel Correct electrolyte abnormalities  Holding home amlodipine, enalapril, HCTZ, metoprolol- need to verify these meds  RENAL A:   Hyponatremia- appears euvolemic - on thiazide at home  Hypokalemia P:   Repeat renal panel now with w/ mag and phos Replace K NS at 75 ml/hr Continue foley  Trend BMP / mag/ phos/ daily wt/ urinary  output Replace electrolytes as indicated Avoid nephrotoxic agents, ensure adequate renal perfusion'  GASTROINTESTINAL A:   Mildly elevated bili- 1.7 P:   NPO Recheck LFT in am XR to confirm OGT   HEMATOLOGIC A:   Leukocytosis  - ?reactive vs infection P:  Trend CBC SCDs only for VTE Transfuse for Hgb < 7  INFECTIOUS A:   r/o aspiration pneumonia P:   Trend WBC/ Fever curve Sending BC and trach asp Assess/ trend PCT  Empiric unasyn   UA pending  ENDOCRINE A:   Hyperglycemia- mild Hypothermia P:   CBG q 4 Add SSI if glucose consistently > 180 Checking HgbA1c Assess TSH bairhugger to rewarm  NEUROLOGIC A:   CVA- R ICA/ MCA s/p revascularization per IR, with left hemiplegia  -possible hx anxiety P:   RASS goal: -1 Wake up assessment now PAD protocol with propofol gtt, prn fentanyl Daily wake up assessment Neurology primary Stroke workup per Neurology/ Neuro IR PT/ OT when appropriate    FAMILY  - Updates: No family present at the time of my evaluation.  Noted that patient lives very independently and quality of life is important.    - Inter-disciplinary family meet or Palliative Care meeting due by: 7/22   CCT 60 mins  Posey Boyer, AGACNP-BC [facility] Pgr: [phone] or if no answer [phone] Oct 15, 2017, 5:27 PM

## 2017-10-05 NOTE — Sedation Documentation (Signed)
IR tech holding pressure to right groin. 

## 2017-10-05 NOTE — H&P (Signed)
Requesting Physician: Dr. Silverio Lay    Chief Complaint: AMS  History obtained from:  EMS  HPI:                                                                                                                                         Rayen Palen is an 84 y.o. female fortunately there is no history and the epic system.  Via care everywhere she is on enalapril likely has hypertension.  There is no history of seizure or stroke.   Per family around 0915 this morning she was normal with a left and upon returning home she was no answering the door.  When they finally entered the house they found her in the bathroom sitting on the toilet staring forward.  They moved her to the floor.  She was noted to have defecated on herself.  EMS brought the patient to the hospital.  CT scan showed a large area of hypodensity with calcification in the right parietal frontal lobe.  Initially in CT her left leg had increased tone however as time is gone on she is starting to move that left leg.  CTA was attempted but do to her moving it was a difficult image.it appears she has a right MCA occlusion. CT perfusion was also difficult due to her moving her head thus she was rushed to MRI to obtain a STAT perfusion.   Unclear clinical picture of seizure vs stroke initially - precluded tPA as well as the fact that family said that she was very 'anxious' as soon as she woke up this morning putting the LKN at last night.  Date last known well: Date: 09/27/2017 Time last known well: initally 0915 but later by family possibly when she went to bed last night 10pm 10/04/2016 tPA Given: No: ?sz v stroke initially, later LKN outside window  Modified Rankin: 1  PMH Due to altered mental status past medical history was not be able to be obtained however on care everywhere she is on antihypertensive medications and there are notes that she is a breast cancer survivor, chronic insomnia, primary osteoarthritis, unsteady gait..  There is no  indication that she has a history of seizure or stroke.   Family History  Problem Relation Age of Onset  . Aneurysm Mother   . Heart disease Brother       Social History:  has no tobacco, alcohol, and drug history on file.  Allergies: Allergies not on file  Medications:  Amlodipine 5 mg daily Enalapril 20 mg tablet daily Hydrochlorothiazide 25 mg tablet daily Lorazepam 0.5 mg tablet at bedtime as needed Metoprolol 100 mg 24-hour tablet daily  ROS:                                                                                                                                       History obtained from unobtainable from patient due to mental status  General Examination:                                                                                                      Blood pressure (!) 145/71, pulse 93, temperature (!) 97.5 F (36.4 C), temperature source Axillary, resp. rate 14, height 5\' 4"  (1.626 m), weight 73.6 kg (162 lb 4.1 oz), SpO2 100 %. HEENT-  Normocephalic, no lesions, without obvious abnormality.  Normal external eye and conjunctiva.   Extremities- Warm, dry and intact Musculoskeletal-no joint tenderness, deformity or swelling Skin-warm and dry, no hyperpigmentation, vitiligo, or suspicious lesions  Neurological Examination MS: drowsy, moaning CN: PERRL, midline gaze, no blink to threat bilaterally, face symmetric. Motor: moving right side spontaneously, strong.  Initially flaccid on the left but then improved to at least 1-2/5 on the left upper and lower extremity. Sensory: poor withdrawal on both lower extremities.  Strong withdrawal in the right upper extremity.  Weak withdrawal in the left upper extremity. Coord-did not cooperate with Gait cannot be tested due to mentation NIH stroke scale 1a Level of Conscious.: 1 1b LOC Questions: 2 1c LOC  Commands: 2 2 Best Gaze: 0 3 Visual: 2 4 Facial Palsy: 0 5a Motor Arm - left: 3 5b Motor Arm - Right: 0 6a Motor Leg - Left: 3 6b Motor Leg - Right: 0 7 Limb Ataxia: 0 8 Sensory: 1 9 Best Language: 3 10 Dysarthria: 2 11 Extinct. and Inatten.:  0 TOTAL: 18    Lab Results: Basic Metabolic Panel: Recent Labs  Lab 18-Oct-2017 1011 October 18, 2017 1017  NA 129* 128*  K 3.0* 3.0*  CL 91* 91*  CO2 25  --   GLUCOSE 125* 125*  BUN 15 16  CREATININE 0.64 0.50  CALCIUM 9.5  --     CBC: Recent Labs  Lab 2017/10/18 1011 10-18-17 1017  WBC 14.5*  --   NEUTROABS PENDING  --   HGB 12.9 15.3*  HCT 39.2 45.0  MCV 67.4*  --   PLT 365  --    CBG: Recent Labs  Lab 06-02-2017 1010  GLUCAP 132*    Imaging: Ct Angio Head W Or Wo Contrast  Result Date: Jul 11, 2017 CLINICAL DATA:  Code stroke. 84 year old female found unresponsive with gaze deviation. Family denies knowledge of a previous stroke however, there is bilateral MCA territory ischemia which most resembles chronic encephalomalacia on the plain CT at 1028 hours today. EXAM: CT ANGIOGRAPHY HEAD AND NECK CT PERFUSION BRAIN TECHNIQUE: Multidetector CT imaging of the head and neck was performed using the standard protocol during bolus administration of intravenous contrast. Multiplanar CT image reconstructions and MIPs were obtained to evaluate the vascular anatomy. Carotid stenosis measurements (when applicable) are obtained utilizing NASCET criteria, using the distal internal carotid diameter as the denominator. Multiphase CT imaging of the brain was performed following IV bolus contrast injection. Subsequent parametric perfusion maps were calculated using RAPID software. CONTRAST:  90mL ISOVUE-370 IOPAMIDOL (ISOVUE-370) INJECTION 76% COMPARISON:  Head CT without contrast 1028 hours today. FINDINGS: CT Brain Perfusion Findings: The CTP is significantly degraded by motion artifact despite. This resulted in automatic exclusion of multiple segments  from the computer analysis, including a significant portion of the arterial input function (series 1401, image 9). CBF (<30%) Volume: 24 milliliters, predominantly in the posterior right hemisphere, but this could be unreliable due to motion. Perfusion (Tmax>6.0s) volume: Bilateral cerebellar and cerebral hemisphere abnormal T-max values up to 200 milliliters, some of this is likely artifactual. There is right greater than left MCA territory abnormal T-max. Infarction Location:Posterior right hemisphere. CTA NECK Skeleton: Advanced cervical spine degeneration. No acute osseous abnormality identified. Upper chest: Mild motion artifact. Negative upper lungs. No superior mediastinal lymphadenopathy. Other neck: Negative; no neck mass or lymphadenopathy. Aortic arch: 3 vessel arch configuration with mild to moderate soft and calcified arch atherosclerosis. Right carotid system: Stenosis. No brachiocephalic or right CCA The right ICA is occluded origin at the distal bulb (series 6, image 94 and series 10, image 87) and remains occluded to the skull base. Left carotid system: Left CCA and proximal left ICA atherosclerosis without significant stenosis. Vertebral arteries: No proximal right subclavian artery stenosis. Mild calcified plaque at the right vertebral artery origin resulting in mild stenosis. Dominant right vertebral artery is patent to the skull base without additional stenosis. No significant proximal left subclavian artery stenosis despite soft and calcified plaque. Soft and calcified plaque near the origin of the non dominant left vertebral artery. Mild associated stenosis. The small left vertebral artery remains patent to the skull base without additional stenosis. CTA HEAD Posterior circulation: The dominant right vertebral artery supplies the basilar. The non dominant left vertebral appears to functionally terminates in PICA. The right PICA origin is patent. No distal right vertebral artery or basilar  artery stenosis. Patent basilar tip. Fetal type right PCA origin, the right posterior communicating artery appears partially thrombosed as seen on series 13, image 15. Despite this, the right PCA remains patent. The distal right PCA branches arm only mildly hypoenhancing compared to the left. Normal left PCA origin. Left PCA branches are within normal limits. Anterior circulation: Patent left ICA siphon with no stenosis. Patent left ICA terminus. Normal left MCA and ACA origins. Left MCA M1 segment, bifurcation, and left MCA branches are within normal limits. The right ICA siphon is occluded, including the anterior portion of the right posterior communicating artery which appears to constitute a fetal type PCA origin. The ACAs and anterior communicating arteries are patent, and probably supplying collateral flow to the right MCA origin. The bilateral ACA branches are  within normal limits. The right MCA M1 is patent, but the right MCA bifurcation is partially occluded. Paucity of right MCA M2 and distal branch enhancement (series 13, image 10). Venous sinuses: Patent on the delayed images. Anatomic variants: Dominant right vertebral artery and fetal type right PCA origin. Delayed phase: The abnormal right greater than left MCA territory hypodensity appears stable since 1029 hours. No definite acute cytotoxic edema. No abnormal enhancement identified. Review of the MIP images confirms the above findings IMPRESSION: 1. Positive for large vessel occlusion of the right ICA, and also the right MCA bifurcation. - furthermore, there is partial occlusion of the right Pcomm which constitutes a fetal type right PCA origin. The right PCA remains patent. - there is collateral flow to the right M1 via the anterior communicating artery and ACAs. 2. Study discussed by telephone with Dr. Milon Dikes on 10/17/2017 at 1055 hours, and again at 11:17. The CT perfusion analysis is likely unreliable due to motion resulting in omission of  some of the arterial input phase. We discussed that in light of the above CTA findings, I suspect the 24 mL core infarct volume in the right hemisphere may be fairly accurate, with the right occipital lobe involvement explained by the fetal right PCA findings above. Probably then, much of the remaining right hemisphere is at risk, aside from that affected by what I suspect is a chronic right operculum infarct. Dr. Wilford Corner will attempt a stat DWI only MRI scan for confirmation, with NIR intervention pending. 3. No superimposed hemodynamically significant stenosis in the left carotid or vertebrobasilar system. The right vertebral is dominant and supplies the basilar. Electronically Signed   By: Odessa Fleming M.D.   On: 10/07/2017 11:22   Ct Angio Neck W Or Wo Contrast  Result Date: 10/09/2017 CLINICAL DATA:  Code stroke. 84 year old female found unresponsive with gaze deviation. Family denies knowledge of a previous stroke however, there is bilateral MCA territory ischemia which most resembles chronic encephalomalacia on the plain CT at 1028 hours today. EXAM: CT ANGIOGRAPHY HEAD AND NECK CT PERFUSION BRAIN TECHNIQUE: Multidetector CT imaging of the head and neck was performed using the standard protocol during bolus administration of intravenous contrast. Multiplanar CT image reconstructions and MIPs were obtained to evaluate the vascular anatomy. Carotid stenosis measurements (when applicable) are obtained utilizing NASCET criteria, using the distal internal carotid diameter as the denominator. Multiphase CT imaging of the brain was performed following IV bolus contrast injection. Subsequent parametric perfusion maps were calculated using RAPID software. CONTRAST:  90mL ISOVUE-370 IOPAMIDOL (ISOVUE-370) INJECTION 76% COMPARISON:  Head CT without contrast 1028 hours today. FINDINGS: CT Brain Perfusion Findings: The CTP is significantly degraded by motion artifact despite. This resulted in automatic exclusion of multiple  segments from the computer analysis, including a significant portion of the arterial input function (series 1401, image 9). CBF (<30%) Volume: 24 milliliters, predominantly in the posterior right hemisphere, but this could be unreliable due to motion. Perfusion (Tmax>6.0s) volume: Bilateral cerebellar and cerebral hemisphere abnormal T-max values up to 200 milliliters, some of this is likely artifactual. There is right greater than left MCA territory abnormal T-max. Infarction Location:Posterior right hemisphere. CTA NECK Skeleton: Advanced cervical spine degeneration. No acute osseous abnormality identified. Upper chest: Mild motion artifact. Negative upper lungs. No superior mediastinal lymphadenopathy. Other neck: Negative; no neck mass or lymphadenopathy. Aortic arch: 3 vessel arch configuration with mild to moderate soft and calcified arch atherosclerosis. Right carotid system: Stenosis. No brachiocephalic or right CCA The right  ICA is occluded origin at the distal bulb (series 6, image 94 and series 10, image 87) and remains occluded to the skull base. Left carotid system: Left CCA and proximal left ICA atherosclerosis without significant stenosis. Vertebral arteries: No proximal right subclavian artery stenosis. Mild calcified plaque at the right vertebral artery origin resulting in mild stenosis. Dominant right vertebral artery is patent to the skull base without additional stenosis. No significant proximal left subclavian artery stenosis despite soft and calcified plaque. Soft and calcified plaque near the origin of the non dominant left vertebral artery. Mild associated stenosis. The small left vertebral artery remains patent to the skull base without additional stenosis. CTA HEAD Posterior circulation: The dominant right vertebral artery supplies the basilar. The non dominant left vertebral appears to functionally terminates in PICA. The right PICA origin is patent. No distal right vertebral artery or  basilar artery stenosis. Patent basilar tip. Fetal type right PCA origin, the right posterior communicating artery appears partially thrombosed as seen on series 13, image 15. Despite this, the right PCA remains patent. The distal right PCA branches arm only mildly hypoenhancing compared to the left. Normal left PCA origin. Left PCA branches are within normal limits. Anterior circulation: Patent left ICA siphon with no stenosis. Patent left ICA terminus. Normal left MCA and ACA origins. Left MCA M1 segment, bifurcation, and left MCA branches are within normal limits. The right ICA siphon is occluded, including the anterior portion of the right posterior communicating artery which appears to constitute a fetal type PCA origin. The ACAs and anterior communicating arteries are patent, and probably supplying collateral flow to the right MCA origin. The bilateral ACA branches are within normal limits. The right MCA M1 is patent, but the right MCA bifurcation is partially occluded. Paucity of right MCA M2 and distal branch enhancement (series 13, image 10). Venous sinuses: Patent on the delayed images. Anatomic variants: Dominant right vertebral artery and fetal type right PCA origin. Delayed phase: The abnormal right greater than left MCA territory hypodensity appears stable since 1029 hours. No definite acute cytotoxic edema. No abnormal enhancement identified. Review of the MIP images confirms the above findings IMPRESSION: 1. Positive for large vessel occlusion of the right ICA, and also the right MCA bifurcation. - furthermore, there is partial occlusion of the right Pcomm which constitutes a fetal type right PCA origin. The right PCA remains patent. - there is collateral flow to the right M1 via the anterior communicating artery and ACAs. 2. Study discussed by telephone with Dr. Milon Dikes on 09/29/2017 at 1055 hours, and again at 11:17. The CT perfusion analysis is likely unreliable due to motion resulting in  omission of some of the arterial input phase. We discussed that in light of the above CTA findings, I suspect the 24 mL core infarct volume in the right hemisphere may be fairly accurate, with the right occipital lobe involvement explained by the fetal right PCA findings above. Probably then, much of the remaining right hemisphere is at risk, aside from that affected by what I suspect is a chronic right operculum infarct. Dr. Wilford Corner will attempt a stat DWI only MRI scan for confirmation, with NIR intervention pending. 3. No superimposed hemodynamically significant stenosis in the left carotid or vertebrobasilar system. The right vertebral is dominant and supplies the basilar. Electronically Signed   By: Odessa Fleming M.D.   On: 10/03/2017 11:22   Mr Brain Wo Contrast  Result Date: 10/13/2017 CLINICAL DATA:  84 year old female is poorly responsive  with right ICA, right MCA bifurcation, and right Pcomm/PCA origin large vessel occlusion on CTA. CT perfusion affected by motion artifact, and confounding chronic appearing right greater than left MCA territory infarcts on presentation noncontrast head CT. EXAM: MRI HEAD WITHOUT CONTRAST LIMITED TECHNIQUE: Diffusion and FLAIR pulse sequences of the brain and surrounding structures were obtained without intravenous contrast. COMPARISON:  Head CT, CTA and CT perfusion earlier today. FINDINGS: Axial diffusion weighted imaging demonstrates confluent abnormal signal in the posterior right hemisphere corresponding to the right MCA and right MCA/PCA watershed territory. On ADC much of this appears restricted on diffusion. Superimposed chronic encephalomalacia at the right frontal operculum. Superimposed cortical and subcortical white matter restricted diffusion in the left PCA territory and left PCA/MCA watershed. There is a small area of cortical encephalomalacia at the left posterior sylvian fissure although there appears to be some curvilinear surrounding cortical restricted  diffusion or susceptibility artifact. Mildly motion degraded axial FLAIR imaging demonstrates the chronic areas of encephalomalacia with little if any FLAIR hyperintensity corresponding to the restricted diffusion. There is no intracranial mass effect. No ventriculomegaly. Basilar cisterns remain patent. IMPRESSION: 1. Right MCA and MCA/PCA watershed territory restricted diffusion suggesting core infarct beyond that estimated on the CTP earlier today. This ischemia appears concordant with the CTA findings of right ICA, right MCA bifurcation and right Pcomm origin occlusion. 2. Additional contralateral Left PCA and MCA PCA watershed territory restricted diffusion. On review of the earlier CTA, there is a paucity of flow in the distal left P3 branches concordant with this finding, although the left P1, P2 segments and left PCA bifurcation appear normal. 3. This study discussed by telephone with Dr. Wilford Corner on beginning at 1127 hours. Electronically Signed   By: Odessa Fleming M.D.   On: 10-10-2017 11:51   Ct Cerebral Perfusion W Contrast  Result Date: October 10, 2017 CLINICAL DATA:  Code stroke. 84 year old female found unresponsive with gaze deviation. Family denies knowledge of a previous stroke however, there is bilateral MCA territory ischemia which most resembles chronic encephalomalacia on the plain CT at 1028 hours today. EXAM: CT ANGIOGRAPHY HEAD AND NECK CT PERFUSION BRAIN TECHNIQUE: Multidetector CT imaging of the head and neck was performed using the standard protocol during bolus administration of intravenous contrast. Multiplanar CT image reconstructions and MIPs were obtained to evaluate the vascular anatomy. Carotid stenosis measurements (when applicable) are obtained utilizing NASCET criteria, using the distal internal carotid diameter as the denominator. Multiphase CT imaging of the brain was performed following IV bolus contrast injection. Subsequent parametric perfusion maps were calculated using RAPID  software. CONTRAST:  90mL ISOVUE-370 IOPAMIDOL (ISOVUE-370) INJECTION 76% COMPARISON:  Head CT without contrast 1028 hours today. FINDINGS: CT Brain Perfusion Findings: The CTP is significantly degraded by motion artifact despite. This resulted in automatic exclusion of multiple segments from the computer analysis, including a significant portion of the arterial input function (series 1401, image 9). CBF (<30%) Volume: 24 milliliters, predominantly in the posterior right hemisphere, but this could be unreliable due to motion. Perfusion (Tmax>6.0s) volume: Bilateral cerebellar and cerebral hemisphere abnormal T-max values up to 200 milliliters, some of this is likely artifactual. There is right greater than left MCA territory abnormal T-max. Infarction Location:Posterior right hemisphere. CTA NECK Skeleton: Advanced cervical spine degeneration. No acute osseous abnormality identified. Upper chest: Mild motion artifact. Negative upper lungs. No superior mediastinal lymphadenopathy. Other neck: Negative; no neck mass or lymphadenopathy. Aortic arch: 3 vessel arch configuration with mild to moderate soft and calcified arch atherosclerosis. Right carotid  system: Stenosis. No brachiocephalic or right CCA The right ICA is occluded origin at the distal bulb (series 6, image 94 and series 10, image 87) and remains occluded to the skull base. Left carotid system: Left CCA and proximal left ICA atherosclerosis without significant stenosis. Vertebral arteries: No proximal right subclavian artery stenosis. Mild calcified plaque at the right vertebral artery origin resulting in mild stenosis. Dominant right vertebral artery is patent to the skull base without additional stenosis. No significant proximal left subclavian artery stenosis despite soft and calcified plaque. Soft and calcified plaque near the origin of the non dominant left vertebral artery. Mild associated stenosis. The small left vertebral artery remains patent to  the skull base without additional stenosis. CTA HEAD Posterior circulation: The dominant right vertebral artery supplies the basilar. The non dominant left vertebral appears to functionally terminates in PICA. The right PICA origin is patent. No distal right vertebral artery or basilar artery stenosis. Patent basilar tip. Fetal type right PCA origin, the right posterior communicating artery appears partially thrombosed as seen on series 13, image 15. Despite this, the right PCA remains patent. The distal right PCA branches arm only mildly hypoenhancing compared to the left. Normal left PCA origin. Left PCA branches are within normal limits. Anterior circulation: Patent left ICA siphon with no stenosis. Patent left ICA terminus. Normal left MCA and ACA origins. Left MCA M1 segment, bifurcation, and left MCA branches are within normal limits. The right ICA siphon is occluded, including the anterior portion of the right posterior communicating artery which appears to constitute a fetal type PCA origin. The ACAs and anterior communicating arteries are patent, and probably supplying collateral flow to the right MCA origin. The bilateral ACA branches are within normal limits. The right MCA M1 is patent, but the right MCA bifurcation is partially occluded. Paucity of right MCA M2 and distal branch enhancement (series 13, image 10). Venous sinuses: Patent on the delayed images. Anatomic variants: Dominant right vertebral artery and fetal type right PCA origin. Delayed phase: The abnormal right greater than left MCA territory hypodensity appears stable since 1029 hours. No definite acute cytotoxic edema. No abnormal enhancement identified. Review of the MIP images confirms the above findings IMPRESSION: 1. Positive for large vessel occlusion of the right ICA, and also the right MCA bifurcation. - furthermore, there is partial occlusion of the right Pcomm which constitutes a fetal type right PCA origin. The right PCA remains  patent. - there is collateral flow to the right M1 via the anterior communicating artery and ACAs. 2. Study discussed by telephone with Dr. Milon Dikes on 10/09/2017 at 1055 hours, and again at 11:17. The CT perfusion analysis is likely unreliable due to motion resulting in omission of some of the arterial input phase. We discussed that in light of the above CTA findings, I suspect the 24 mL core infarct volume in the right hemisphere may be fairly accurate, with the right occipital lobe involvement explained by the fetal right PCA findings above. Probably then, much of the remaining right hemisphere is at risk, aside from that affected by what I suspect is a chronic right operculum infarct. Dr. Wilford Corner will attempt a stat DWI only MRI scan for confirmation, with NIR intervention pending. 3. No superimposed hemodynamically significant stenosis in the left carotid or vertebrobasilar system. The right vertebral is dominant and supplies the basilar. Electronically Signed   By: Odessa Fleming M.D.   On: 10/07/2017 11:22   Dg Chest Port 1 View  Result  Date: 10/04/2017 CLINICAL DATA:  Intubation EXAM: PORTABLE CHEST 1 VIEW COMPARISON:  None. FINDINGS: Endotracheal tube is 6 cm above the carina. NG tube is in the stomach. Heart is borderline in size. Bilateral perihilar and lower lobe opacities could reflect early edema or infection. No effusions or pneumothorax. Degenerative changes in the shoulders and thoracic spine. Aortic atherosclerosis. IMPRESSION: Borderline heart size. Perihilar and lower lobe airspace opacities bilaterally could reflect edema or infection. Endotracheal tube 6 cm above the carina. Electronically Signed   By: Charlett Nose M.D.   On: 10/06/2017 12:12   Ct Head Code Stroke Wo Contrast  Result Date: 10/09/2017 CLINICAL DATA:  Code stroke. 84 year old female found unresponsive with gaze deviation. Family denies knowledge of a previous stroke. EXAM: CT HEAD WITHOUT CONTRAST TECHNIQUE: Contiguous axial  images were obtained from the base of the skull through the vertex without intravenous contrast. COMPARISON:  No prior head CT. FINDINGS: Brain: There is a 3-4 centimeter area of hypodensity in the right MCA territory centered at the insula and operculum which has a chronic appearance. Some of this demonstrates dystrophic calcification. There is no associated mass effect. There is a smaller area of cortical hypodensity along the posterior left sylvian fissure which also has a chronic appearance (sagittal image 43). No associated mass effect. No definite acute cytotoxic edema identified. No intracranial hemorrhage or mass effect. No other cortical encephalomalacia identified. No ventriculomegaly. Patent basilar cisterns. Vascular: Calcified atherosclerosis at the skull base. No suspicious intracranial vascular hyperdensity. Skull: Intermittent motion artifact. No acute osseous abnormality identified. Sinuses/Orbits: Visualized paranasal sinuses and mastoids are well pneumatized. Other: Gaze appears to be midline. Postoperative changes to both globes. No acute scalp soft tissue findings. ASPECTS [facility] Stroke Program Early CT Score) Total score (0-10 with 10 being normal): 10, allowing for bilateral MCA territory chronic appearing encephalomalacia. IMPRESSION: 1. Prior right greater than left MCA territory ischemia which is favored to be chronic. No intracranial hemorrhage, intracranial mass effect, or definite acute cytotoxic edema. 2. This was discussed by telephone with Dr. Wilford Corner on 09/29/2017 at 1055 hours. Electronically Signed   By: Odessa Fleming M.D.   On: 10/13/2017 10:59   MRI IMPRESSION: 1. Right MCA and MCA/PCA watershed territory restricted diffusion suggesting core infarct beyond that estimated on the CTP earlier Today. This ischemia appears concordant with the CTA findings of right ICA, right MCA bifurcation and right Pcomm origin occlusion. 2. Additional contralateral Left PCA and MCA PCA watershed  territory restricted diffusion. On review of the earlier CTA, there is a paucity of flow in the distal left P3 branches concordant with this finding, although the left P1, P2 segments and left PCA bifurcation appear normal. 3. This study discussed by telephone with Dr. Wilford Corner on beginning at 1127 hours.  Assessment and plan discussed with with attending physician and they are in agreement.    Felicie Morn PA-C [facility] [phone]  09/30/2017, 10:40 AM  Attending Neurohospitalist Addendum Patient seen and examined with APP/Resident. Agree with the history and physical as documented above. Agree with the plan as documented, which I helped formulate. I have independently reviewed the chart, obtained history, review of systems and examined the patient.I have personally reviewed pertinent head/neck/spine imaging (CT/MRI). CTH no acute changes, old RMCA stroke with calcification vs. Vascular malformation. CTA: complex due to motion. Right ICA age indeterminate occlusion, RMCA occlusion likely acute, fetal Rt PCA. Limited MRI - rt PCA and MCA territory restricted diff.. Also seen restricted diffusion in the left PCA/MCA watershed  Assessment 84 year old woman with past medical history only of hypertension presented to the emergency room for evaluation of acute episode of unresponsiveness followed by left-sided weakness. Noncontrast CT of the head showed a large area of encephalomalacia in the right MCA territory with superimposed calcification versus artery venous malformation-which upon checking with the family-they have no information about patient having had a stroke. Her presentation is puzzling.  A CT angiogram head and neck and a CT perfusion was obtained because initially last seen normal was 9:15 in the morning but later the family said that she woke up "anxious" this morning and has a history of some anxiety so was difficult to tease out whether she was at her baseline when  she woke up this morning or not. CT angiogram head and neck shows right ICA occlusion of age-indeterminate, right MCA occlusion which look more acute at the bifurcation, no major large vessel occlusions on the left side. CT perfusion study was motion little and per radiology discussion uninterpretable. Patient was taken for a stat MRI of the brain for limited DWI sequences that showed large right hemispherical stroke in the right MCA and right PCA territory along with left MCA PCA watershed and left PCA strokes. Given the unclear history and somewhat atypical presentation of bilateral strokes, detailed discussion was had with the family-daughter and a physician in the family who were at bedside.  Options including no intervention versus diagnostic cerebral angiogram and possible carotid stenting and thrombectomy were discussed. The family expressed that the patient has expressed strong desire to leave as independent life as possible and would not like to have life prolonging measures if that is not possible. After detailed discussion with the family, decision was made to pursue interventional radiology guided diagnostic cerebral angiogram and attempted right carotid revascularization.  Dr. Corliss Skains discussed the procedure and the risks associated with it with the family in detail. The patient is currently undergoing the diagnostic surveillance gram and will be admitted to the neurological ICU after the procedure.   Plan Acute Ischemic Stroke Cerebral infarction due to embolism of right middle cerebral artery Cerebral infarction due to embolism of left middle cerebral artery   Acuity: Acute Current Suspected Etiology: ? Continue Evaluation:  -Admit to: Neurological ICU  -Blood pressure control, goal of SYS <140 if recanalized. If not recanalized, allow permissive hypertension and treat only if SBP is >220. -MRI/ECHO/A1C/Lipid panel. -Hyperglycemia management per SSI to maintain glucose  140-180mg /dL. -PT/OT/ST therapies and recommendations when able  CNS -Close neuro monitoring  Dysarthria Dysphagia following cerebral infarction  -NPO until cleared by speech -May need PEG  Hemiplegia -PT/OT  Toxic encephalopathy -Correct metabolic causes -Monitor  RESP Intubated electively as she was unable to protect her airway. -Vent management per ICU -Extubate when able  CV Essential (primary) hypertension -Blood pressure goes as above.  Use Cardene and labetalol as needed. -TTE  Hyperlipidemia, unspecified  - Statin for goal LDL < 70  HEME -Monitor -transfuse for hgb < 7  ENDO -goal HgbA1c < 7  GI/GU -Gentle hydration  Fluid/Electrolyte Disorders -Replete -Repeat labs  ID Possible Aspiration PNA -CXR -NPO -Monitor  Nutrition E66.9 Obesity  -diet consult  Prophylaxis DVT: SCDs GI: Per vent bundle Bowel: Docusate/senna  Diet: NPO until cleared by speech  Code Status: Full Code  Had a detailed discussion about patient's current condition, reviewed imaging with the family including daughter and a family friend who is a Museum/gallery exhibitions officer. Explained the complex nature of her presentation and large size of stroke  that might portend poor prognosis. Family understanding of the current clinical situation.  All questions were answered. Stroke service will continue to follow with you.  --- Milon Dikes, MD [facility] Pager: [phone]  If 7pm to 7am, please call on call as listed on AMION.  CRITICAL CARE ATTESTATION This patient is critically ill and at significant risk of neurological worsening, death and care requires constant monitoring of vital signs, hemodynamics,respiratory and cardiac monitoring. I spent 60  minutes of neurocritical care time performing neurological assessment, discussion with family, other specialists and medical decision making of high complexityin the care of  this patient.

## 2017-10-05 NOTE — Procedures (Signed)
Bilateral common carotid arteriograms followed by complete revasdcularization of occluded RT ICA cavernous to terminus occlusion , and of the dominant RT PCOM supplying the PCAs  x 4 passes with embotrap x 33mm  devicee achieving a TICI 2 b reperfusion

## 2017-10-05 NOTE — Progress Notes (Signed)
Patient ID: Kathryn Johnson, female   DOB: [birth year], 84 y.o.   MRN: [id number] INR. 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 CT  Brain No gross hemorrhage. CTA occluded RT ICA intracranially and extracranially.,and ? RT MCA at the bifurcation. CT perfusion  .Marland Kitchen. Bilateral parieto occipital  penumbra areas. CTP CBF < 30 % vol 24 ml  Tmax> 6.0 s vol 200 ml . Mismatch vol 176 ml mismatch ratio 8.3 .6 Findings discussed with spouse and daughter. Option of endovascular revascularization to prevent further neurological injury D/W spouse and daughter. Reasons,risks ,alternatives all reviewed. Risks of ICH of 10 %,worsening neuro deficit ,vent dependency,death,inability to revascularize ,vascular injury were reviewed .Questions were answered to their satisfaction. Informed witnessed consent was obtained for endovascular revascularization under GA. S.Shavar Gorka MD

## 2017-10-05 NOTE — Progress Notes (Signed)
Transported pt from ED Trauma B to IR 2 on ventilator. Pt stable throughout with no complications.

## 2017-10-05 NOTE — Anesthesia Preprocedure Evaluation (Signed)
Anesthesia Evaluation  Patient identified by MRN, date of birth, ID band Patient unresponsive    Reviewed: Unable to perform ROS - Chart review onlyPreop documentation limited or incomplete due to emergent nature of procedure.  Airway        Dental   Pulmonary           Cardiovascular      Neuro/Psych    GI/Hepatic   Endo/Other    Renal/GU      Musculoskeletal   Abdominal   Peds  Hematology   Anesthesia Other Findings Code stroke. Patient arrived intubated   Reproductive/Obstetrics                             Anesthesia Physical Anesthesia Plan  ASA: IV and emergent  Anesthesia Plan: General   Post-op Pain Management:    Induction:   PONV Risk Score and Plan: Treatment may vary due to age or medical condition  Airway Management Planned: Oral ETT  Additional Equipment:   Intra-op Plan:   Post-operative Plan: Post-operative intubation/ventilation  Informed Consent: I have reviewed the patients History and Physical, chart, labs and discussed the procedure including the risks, benefits and alternatives for the proposed anesthesia with the patient or authorized representative who has indicated his/her understanding and acceptance.   History available from chart only and Only emergency history available  Plan Discussed with:   Anesthesia Plan Comments:         Anesthesia Quick Evaluation

## 2017-10-05 NOTE — Sedation Documentation (Signed)
Patient intubated with Diprovan hanging at this time. Unable to obtain NIH.

## 2017-10-05 NOTE — ED Notes (Signed)
MD Silverio LayYao made aware of patient airway compromise, at bedside to intubate.

## 2017-10-05 NOTE — Transfer of Care (Signed)
Immediate Anesthesia Transfer of Care Note  Patient: Kathryn Johnson  Procedure(s) Performed: IR WITH ANESTHESIA CODE STROKE WITH ANESTHESIA (N/A )  Patient Location: ICU  Anesthesia Type:General  Level of Consciousness: Patient remains intubated per anesthesia plan  Airway & Oxygen Therapy: Patient remains intubated per anesthesia plan and Patient placed on Ventilator (see vital sign flow sheet for setting)  Post-op Assessment: Report given to RN and Post -op Vital signs reviewed and stable  Post vital signs: Reviewed and stable  Last Vitals:  Vitals Value Taken Time  BP 108/58 05-Jul-2017  4:48 PM  Temp 34.2 C 05-Jul-2017  4:56 PM  Pulse 49 05-Jul-2017  4:56 PM  Resp 16 05-Jul-2017  4:56 PM  SpO2 100 % 05-Jul-2017  4:56 PM  Vitals shown include unvalidated device data.  Last Pain:  Vitals:   01/05/2018 1025  TempSrc: Axillary         Complications: No apparent anesthesia complications

## 2017-10-06 ENCOUNTER — Encounter (HOSPITAL_COMMUNITY): Payer: Self-pay | Admitting: Interventional Radiology

## 2017-10-06 ENCOUNTER — Inpatient Hospital Stay (HOSPITAL_COMMUNITY): Payer: Medicare Other

## 2017-10-06 ENCOUNTER — Ambulatory Visit (HOSPITAL_COMMUNITY): Payer: Medicare Other

## 2017-10-06 DIAGNOSIS — I6601 Occlusion and stenosis of right middle cerebral artery: Secondary | ICD-10-CM

## 2017-10-06 DIAGNOSIS — E785 Hyperlipidemia, unspecified: Secondary | ICD-10-CM

## 2017-10-06 DIAGNOSIS — I952 Hypotension due to drugs: Secondary | ICD-10-CM

## 2017-10-06 DIAGNOSIS — I361 Nonrheumatic tricuspid (valve) insufficiency: Secondary | ICD-10-CM

## 2017-10-06 DIAGNOSIS — I6521 Occlusion and stenosis of right carotid artery: Secondary | ICD-10-CM

## 2017-10-06 DIAGNOSIS — I639 Cerebral infarction, unspecified: Secondary | ICD-10-CM

## 2017-10-06 DIAGNOSIS — I63411 Cerebral infarction due to embolism of right middle cerebral artery: Principal | ICD-10-CM

## 2017-10-06 DIAGNOSIS — J9690 Respiratory failure, unspecified, unspecified whether with hypoxia or hypercapnia: Secondary | ICD-10-CM

## 2017-10-06 DIAGNOSIS — I340 Nonrheumatic mitral (valve) insufficiency: Secondary | ICD-10-CM

## 2017-10-06 LAB — CBC WITH DIFFERENTIAL/PLATELET
BASOS ABS: 0 10*3/uL (ref 0.0–0.1)
Basophils Relative: 0 %
Eosinophils Absolute: 0 10*3/uL (ref 0.0–0.7)
Eosinophils Relative: 0 %
HCT: 29.2 % — ABNORMAL LOW (ref 36.0–46.0)
HEMOGLOBIN: 9.3 g/dL — AB (ref 12.0–15.0)
LYMPHS PCT: 8 %
Lymphs Abs: 1.1 10*3/uL (ref 0.7–4.0)
MCH: 21.3 pg — ABNORMAL LOW (ref 26.0–34.0)
MCHC: 31.8 g/dL (ref 30.0–36.0)
MCV: 67 fL — ABNORMAL LOW (ref 78.0–100.0)
MONOS PCT: 8 %
Monocytes Absolute: 1.1 10*3/uL — ABNORMAL HIGH (ref 0.1–1.0)
NEUTROS ABS: 11.2 10*3/uL — AB (ref 1.7–7.7)
Neutrophils Relative %: 84 %
Platelets: 148 10*3/uL — ABNORMAL LOW (ref 150–400)
RBC: 4.36 MIL/uL (ref 3.87–5.11)
RDW: 20.1 % — ABNORMAL HIGH (ref 11.5–15.5)
WBC: 13.4 10*3/uL — ABNORMAL HIGH (ref 4.0–10.5)

## 2017-10-06 LAB — ECHOCARDIOGRAM COMPLETE
HEIGHTINCHES: 64 in
WEIGHTICAEL: 2645.52 [oz_av]

## 2017-10-06 LAB — GLUCOSE, CAPILLARY
GLUCOSE-CAPILLARY: 104 mg/dL — AB (ref 70–99)
GLUCOSE-CAPILLARY: 97 mg/dL (ref 70–99)
Glucose-Capillary: 97 mg/dL (ref 70–99)
Glucose-Capillary: 104 mg/dL — ABNORMAL HIGH (ref 70–99)
Glucose-Capillary: 108 mg/dL — ABNORMAL HIGH (ref 70–99)
Glucose-Capillary: 108 mg/dL — ABNORMAL HIGH (ref 70–99)

## 2017-10-06 LAB — RENAL FUNCTION PANEL
ALBUMIN: 2.8 g/dL — AB (ref 3.5–5.0)
ANION GAP: 8 (ref 5–15)
BUN: 10 mg/dL (ref 8–23)
CALCIUM: 7.4 mg/dL — AB (ref 8.9–10.3)
CO2: 23 mmol/L (ref 22–32)
CREATININE: 0.57 mg/dL (ref 0.44–1.00)
Chloride: 103 mmol/L (ref 98–111)
GFR calc non Af Amer: >60 mL/min (ref >60)
GLUCOSE: 101 mg/dL — AB (ref 70–99)
PHOSPHORUS: 2.3 mg/dL — AB (ref 2.5–4.6)
Potassium: 3.1 mmol/L — ABNORMAL LOW (ref 3.5–5.1)
SODIUM: 134 mmol/L — AB (ref 135–145)

## 2017-10-06 LAB — LIPID PANEL
Cholesterol: 142 mg/dL (ref 0–200)
HDL: 27 mg/dL — ABNORMAL LOW (ref >40)
LDL Cholesterol: 85 mg/dL (ref 0–99)
TRIGLYCERIDES: 149 mg/dL (ref <150)
Total CHOL/HDL Ratio: 5.3 RATIO
VLDL: 30 mg/dL (ref 0–40)

## 2017-10-06 LAB — PROCALCITONIN: Procalcitonin: <0.1 ng/mL

## 2017-10-06 MED ORDER — METOPROLOL SUCCINATE ER 25 MG PO TB24: 25.0000 mg | ORAL_TABLET | Freq: Every day | ORAL | Status: DC

## 2017-10-06 MED ORDER — METOPROLOL TARTRATE 25 MG/10 ML ORAL SUSPENSION
50.0000 mg | Freq: Two times a day (BID) | ORAL | Status: DC
  Administered 2017-10-07 (×2): 50 mg
  Filled 2017-10-06 (×4): qty 20

## 2017-10-06 MED ORDER — ASPIRIN 325 MG PO TABS
325.0000 mg | ORAL_TABLET | Freq: Every day | ORAL | Status: DC
  Administered 2017-10-06 – 2017-10-07 (×2): 325 mg via ORAL
  Filled 2017-10-06 (×2): qty 1

## 2017-10-06 MED ORDER — METOPROLOL SUCCINATE ER 50 MG PO TB24: 100.0000 mg | ORAL_TABLET | Freq: Every day | ORAL | Status: DC

## 2017-10-06 MED ORDER — HEPARIN SODIUM (PORCINE) 5000 UNIT/ML IJ SOLN
5000.0000 [IU] | Freq: Three times a day (TID) | INTRAMUSCULAR | Status: DC
  Administered 2017-10-06 – 2017-10-07 (×3): 5000 [IU] via SUBCUTANEOUS
  Filled 2017-10-06 (×3): qty 1

## 2017-10-06 MED ORDER — ATORVASTATIN CALCIUM 20 MG PO TABS
20.0000 mg | ORAL_TABLET | Freq: Every day | ORAL | Status: DC
  Administered 2017-10-06: 20 mg via ORAL
  Filled 2017-10-06: qty 1

## 2017-10-06 MED ORDER — ASPIRIN 300 MG RE SUPP: 300.0000 mg | Freq: Every day | RECTAL | Status: DC

## 2017-10-06 MED ORDER — POTASSIUM CHLORIDE 10 MEQ/100ML IV SOLN
10.0000 meq | INTRAVENOUS | Status: AC
Start: 2017-10-06 — End: 2017-10-06
  Administered 2017-10-06 (×3): 10 meq via INTRAVENOUS
  Filled 2017-10-06 (×4): qty 100

## 2017-10-06 NOTE — Progress Notes (Addendum)
Called about alternating tachycardia to 120-130's with highest in 140's, lasting for periods of 2-3 seconds, unsustained, then back NSR at rate of 90's.   Clevidipine drip was restarted with heart rate back down to 90's and sinus rhythm.   EKG has been obtained, revealing sinus rhythm with premature atrial complexes.   She was on several antihypertensive medications at home, including metoprolol extended release at 125 mg qd; this cannot be given via NGT however. Discussed with pharmacy and will start the patient on metoprolol liquid formulation at 50 mg BID via NGT, first dose now.   Electronically signed: Dr. Caryl PinaEric Rosario Kushner

## 2017-10-06 NOTE — Progress Notes (Signed)
  Echocardiogram 2D Echocardiogram has been performed.  Kathryn Johnson 10/06/2017, 1:45 PM

## 2017-10-06 NOTE — Progress Notes (Signed)
STROKE TEAM PROGRESS NOTE   SUBJECTIVE (INTERVAL HISTORY) Her daughter and son in law are at the bedside.  Pt still intubated on vent, on propofol. Mild restless, not open eyes or following any commands. Left side hemiplegia. CT am showed large right MCA and small left MCA and PCA infarcts.    OBJECTIVE Temp:  [93.2 F (34 C)-100 F (37.8 C)] 99.9 F (37.7 C) (07/16 0700) Pulse Rate:  [46-83] 68 (07/16 0831) Cardiac Rhythm: Sinus bradycardia;Normal sinus rhythm (07/15 2000) Resp:  [14-26] 24 (07/16 0831) BP: (90-157)/(46-105) 157/62 (07/16 0831) SpO2:  [97 %-100 %] 97 % (07/16 0831) Arterial Line BP: (63-146)/(42-59) 141/52 (07/16 0700) FiO2 (%):  [40 %-100 %] 40 % (07/16 0831) Weight:  [162 lb 4.1 oz (73.6 kg)-165 lb 5.5 oz (75 kg)] 165 lb 5.5 oz (75 kg) (07/15 1650)  Recent Labs  Lab 10/07/2017 1010 09/29/2017 1930 10/21/2017 2312 10/06/17 0324 10/06/17 0745  GLUCAP 132* 106* 88 97 104*   Recent Labs  Lab 09/30/2017 1011 10/07/2017 1017 10/01/2017 1711 10/06/17 0315  NA 129* 128* 131* 134*  K 3.0* 3.0* 2.4* 3.1*  CL 91* 91* 99 103  CO2 25  --  23 23  GLUCOSE 125* 125* 113* 101*  BUN 15 16 11 10   CREATININE 0.64 0.50 0.53 0.57  CALCIUM 9.5  --  7.0* 7.4*  MG  --   --  1.3*  --   PHOS  --   --  2.8 2.3*   Recent Labs  Lab 09/21/2017 1011 10/01/2017 1711 10/06/17 0315  AST 23  --   --   ALT 19  --   --   ALKPHOS 62  --   --   BILITOT 1.7*  --   --   PROT 7.6  --   --   ALBUMIN 4.2 2.8* 2.8*   Recent Labs  Lab 10/07/2017 1011 10/07/2017 1017 10/06/17 0315  WBC 14.5*  --  13.4*  NEUTROABS 12.2*  --  11.2*  HGB 12.9 15.3* 9.3*  HCT 39.2 45.0 29.2*  MCV 67.4*  --  67.0*  PLT 365  --  148*   No results for input(s): CKTOTAL, CKMB, CKMBINDEX, TROPONINI in the last 168 hours. Recent Labs    10/13/2017 1011  LABPROT 13.5  INR 1.04   No results for input(s): COLORURINE, LABSPEC, PHURINE, GLUCOSEU, HGBUR, BILIRUBINUR, KETONESUR, PROTEINUR, UROBILINOGEN, NITRITE,  LEUKOCYTESUR in the last 72 hours.  Invalid input(s): APPERANCEUR     Component Value Date/Time   CHOL 142 10/06/2017 0315   TRIG 149 10/06/2017 0315   HDL 27 (L) 10/06/2017 0315   CHOLHDL 5.3 10/06/2017 0315   VLDL 30 10/06/2017 0315   LDLCALC 85 10/06/2017 0315   No results found for: HGBA1C No results found for: LABOPIA, COCAINSCRNUR, LABBENZ, AMPHETMU, THCU, LABBARB  Recent Labs  Lab 10/10/2017 1011  ETH <10    I have personally reviewed the radiological images below and agree with the radiology interpretations.  Ct Angio Head W Or Wo Contrast  Result Date: 10/02/2017 CLINICAL DATA:  Code stroke. 84 year old female found unresponsive with gaze deviation. Family denies knowledge of a previous stroke however, there is bilateral MCA territory ischemia which most resembles chronic encephalomalacia on the plain CT at 1028 hours today. EXAM: CT ANGIOGRAPHY HEAD AND NECK CT PERFUSION BRAIN TECHNIQUE: Multidetector CT imaging of the head and neck was performed using the standard protocol during bolus administration of intravenous contrast. Multiplanar CT image reconstructions and MIPs were obtained to evaluate  the vascular anatomy. Carotid stenosis measurements (when applicable) are obtained utilizing NASCET criteria, using the distal internal carotid diameter as the denominator. Multiphase CT imaging of the brain was performed following IV bolus contrast injection. Subsequent parametric perfusion maps were calculated using RAPID software. CONTRAST:  90mL ISOVUE-370 IOPAMIDOL (ISOVUE-370) INJECTION 76% COMPARISON:  Head CT without contrast 1028 hours today. FINDINGS: CT Brain Perfusion Findings: The CTP is significantly degraded by motion artifact despite. This resulted in automatic exclusion of multiple segments from the computer analysis, including a significant portion of the arterial input function (series 1401, image 9). CBF (<30%) Volume: 24 milliliters, predominantly in the posterior right  hemisphere, but this could be unreliable due to motion. Perfusion (Tmax>6.0s) volume: Bilateral cerebellar and cerebral hemisphere abnormal T-max values up to 200 milliliters, some of this is likely artifactual. There is right greater than left MCA territory abnormal T-max. Infarction Location:Posterior right hemisphere. CTA NECK Skeleton: Advanced cervical spine degeneration. No acute osseous abnormality identified. Upper chest: Mild motion artifact. Negative upper lungs. No superior mediastinal lymphadenopathy. Other neck: Negative; no neck mass or lymphadenopathy. Aortic arch: 3 vessel arch configuration with mild to moderate soft and calcified arch atherosclerosis. Right carotid system: Stenosis. No brachiocephalic or right CCA The right ICA is occluded origin at the distal bulb (series 6, image 94 and series 10, image 87) and remains occluded to the skull base. Left carotid system: Left CCA and proximal left ICA atherosclerosis without significant stenosis. Vertebral arteries: No proximal right subclavian artery stenosis. Mild calcified plaque at the right vertebral artery origin resulting in mild stenosis. Dominant right vertebral artery is patent to the skull base without additional stenosis. No significant proximal left subclavian artery stenosis despite soft and calcified plaque. Soft and calcified plaque near the origin of the non dominant left vertebral artery. Mild associated stenosis. The small left vertebral artery remains patent to the skull base without additional stenosis. CTA HEAD Posterior circulation: The dominant right vertebral artery supplies the basilar. The non dominant left vertebral appears to functionally terminates in PICA. The right PICA origin is patent. No distal right vertebral artery or basilar artery stenosis. Patent basilar tip. Fetal type right PCA origin, the right posterior communicating artery appears partially thrombosed as seen on series 13, image 15. Despite this, the right  PCA remains patent. The distal right PCA branches arm only mildly hypoenhancing compared to the left. Normal left PCA origin. Left PCA branches are within normal limits. Anterior circulation: Patent left ICA siphon with no stenosis. Patent left ICA terminus. Normal left MCA and ACA origins. Left MCA M1 segment, bifurcation, and left MCA branches are within normal limits. The right ICA siphon is occluded, including the anterior portion of the right posterior communicating artery which appears to constitute a fetal type PCA origin. The ACAs and anterior communicating arteries are patent, and probably supplying collateral flow to the right MCA origin. The bilateral ACA branches are within normal limits. The right MCA M1 is patent, but the right MCA bifurcation is partially occluded. Paucity of right MCA M2 and distal branch enhancement (series 13, image 10). Venous sinuses: Patent on the delayed images. Anatomic variants: Dominant right vertebral artery and fetal type right PCA origin. Delayed phase: The abnormal right greater than left MCA territory hypodensity appears stable since 1029 hours. No definite acute cytotoxic edema. No abnormal enhancement identified. Review of the MIP images confirms the above findings IMPRESSION: 1. Positive for large vessel occlusion of the right ICA, and also the right MCA bifurcation. -  furthermore, there is partial occlusion of the right Pcomm which constitutes a fetal type right PCA origin. The right PCA remains patent. - there is collateral flow to the right M1 via the anterior communicating artery and ACAs. 2. Study discussed by telephone with Dr. Milon Dikes on 10-27-2017 at 1055 hours, and again at 11:17. The CT perfusion analysis is likely unreliable due to motion resulting in omission of some of the arterial input phase. We discussed that in light of the above CTA findings, I suspect the 24 mL core infarct volume in the right hemisphere may be fairly accurate, with the right  occipital lobe involvement explained by the fetal right PCA findings above. Probably then, much of the remaining right hemisphere is at risk, aside from that affected by what I suspect is a chronic right operculum infarct. Dr. Wilford Corner will attempt a stat DWI only MRI scan for confirmation, with NIR intervention pending. 3. No superimposed hemodynamically significant stenosis in the left carotid or vertebrobasilar system. The right vertebral is dominant and supplies the basilar. Electronically Signed   By: Odessa Fleming M.D.   On: 2017/10/27 11:22   Ct Head Wo Contrast  Result Date: 10/06/2017 CLINICAL DATA:  Altered level of consciousness EXAM: CT HEAD WITHOUT CONTRAST TECHNIQUE: Contiguous axial images were obtained from the base of the skull through the vertex without intravenous contrast. COMPARISON:  10/27/2017 FINDINGS: Brain: Motion artifact degrades image quality. There is new diffuse low-density noted throughout much of the right cerebral hemisphere, most notable in the temporal, parietal and occipital lobes. New low-density also noted posteriorly in the left occipital lobe and posterior parietal lobe. Findings are compatible with acute infarctions. Old infarcts noted in the right frontotemporal lobe with associated parenchymal calcifications and in the left posterior frontal lobe. No hydrocephalus or hemorrhage. Vascular: No evidence of aneurysm or adenopathy. Skull: No acute calvarial abnormality. Sinuses/Orbits: Visualized paranasal sinuses and mastoids clear. Orbital soft tissues unremarkable. Other: None IMPRESSION: New large acute infarcts involving much of the mid and posterior right cerebral hemisphere as well as the left occipital lobe. 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 No hemorrhage. Electronically Signed   By: Charlett Nose M.D.   On: 10/06/2017 08:32   Ct Angio Neck W Or Wo Contrast  Result Date: 10-27-17 CLINICAL DATA:  Code stroke. 84 year old female found unresponsive  with gaze deviation. Family denies knowledge of a previous stroke however, there is bilateral MCA territory ischemia which most resembles chronic encephalomalacia on the plain CT at 1028 hours today. EXAM: CT ANGIOGRAPHY HEAD AND NECK CT PERFUSION BRAIN TECHNIQUE: Multidetector CT imaging of the head and neck was performed using the standard protocol during bolus administration of intravenous contrast. Multiplanar CT image reconstructions and MIPs were obtained to evaluate the vascular anatomy. Carotid stenosis measurements (when applicable) are obtained utilizing NASCET criteria, using the distal internal carotid diameter as the denominator. Multiphase CT imaging of the brain was performed following IV bolus contrast injection. Subsequent parametric perfusion maps were calculated using RAPID software. CONTRAST:  90mL ISOVUE-370 IOPAMIDOL (ISOVUE-370) INJECTION 76% COMPARISON:  Head CT without contrast 1028 hours today. FINDINGS: CT Brain Perfusion Findings: The CTP is significantly degraded by motion artifact despite. This resulted in automatic exclusion of multiple segments from the computer analysis, including a significant portion of the arterial input function (series 1401, image 9). CBF (<30%) Volume: 24 milliliters, predominantly in the posterior right hemisphere, but this could be unreliable due to motion. Perfusion (Tmax>6.0s) volume: Bilateral cerebellar and cerebral hemisphere  abnormal T-max values up to 200 milliliters, some of this is likely artifactual. There is right greater than left MCA territory abnormal T-max. Infarction Location:Posterior right hemisphere. CTA NECK Skeleton: Advanced cervical spine degeneration. No acute osseous abnormality identified. Upper chest: Mild motion artifact. Negative upper lungs. No superior mediastinal lymphadenopathy. Other neck: Negative; no neck mass or lymphadenopathy. Aortic arch: 3 vessel arch configuration with mild to moderate soft and calcified arch  atherosclerosis. Right carotid system: Stenosis. No brachiocephalic or right CCA The right ICA is occluded origin at the distal bulb (series 6, image 94 and series 10, image 87) and remains occluded to the skull base. Left carotid system: Left CCA and proximal left ICA atherosclerosis without significant stenosis. Vertebral arteries: No proximal right subclavian artery stenosis. Mild calcified plaque at the right vertebral artery origin resulting in mild stenosis. Dominant right vertebral artery is patent to the skull base without additional stenosis. No significant proximal left subclavian artery stenosis despite soft and calcified plaque. Soft and calcified plaque near the origin of the non dominant left vertebral artery. Mild associated stenosis. The small left vertebral artery remains patent to the skull base without additional stenosis. CTA HEAD Posterior circulation: The dominant right vertebral artery supplies the basilar. The non dominant left vertebral appears to functionally terminates in PICA. The right PICA origin is patent. No distal right vertebral artery or basilar artery stenosis. Patent basilar tip. Fetal type right PCA origin, the right posterior communicating artery appears partially thrombosed as seen on series 13, image 15. Despite this, the right PCA remains patent. The distal right PCA branches arm only mildly hypoenhancing compared to the left. Normal left PCA origin. Left PCA branches are within normal limits. Anterior circulation: Patent left ICA siphon with no stenosis. Patent left ICA terminus. Normal left MCA and ACA origins. Left MCA M1 segment, bifurcation, and left MCA branches are within normal limits. The right ICA siphon is occluded, including the anterior portion of the right posterior communicating artery which appears to constitute a fetal type PCA origin. The ACAs and anterior communicating arteries are patent, and probably supplying collateral flow to the right MCA origin. The  bilateral ACA branches are within normal limits. The right MCA M1 is patent, but the right MCA bifurcation is partially occluded. Paucity of right MCA M2 and distal branch enhancement (series 13, image 10). Venous sinuses: Patent on the delayed images. Anatomic variants: Dominant right vertebral artery and fetal type right PCA origin. Delayed phase: The abnormal right greater than left MCA territory hypodensity appears stable since 1029 hours. No definite acute cytotoxic edema. No abnormal enhancement identified. Review of the MIP images confirms the above findings IMPRESSION: 1. Positive for large vessel occlusion of the right ICA, and also the right MCA bifurcation. - furthermore, there is partial occlusion of the right Pcomm which constitutes a fetal type right PCA origin. The right PCA remains patent. - there is collateral flow to the right M1 via the anterior communicating artery and ACAs. 2. Study discussed by telephone with Dr. Milon Dikes on 10/14/2017 at 1055 hours, and again at 11:17. The CT perfusion analysis is likely unreliable due to motion resulting in omission of some of the arterial input phase. We discussed that in light of the above CTA findings, I suspect the 24 mL core infarct volume in the right hemisphere may be fairly accurate, with the right occipital lobe involvement explained by the fetal right PCA findings above. Probably then, much of the remaining right hemisphere is at  risk, aside from that affected by what I suspect is a chronic right operculum infarct. Dr. Wilford Corner will attempt a stat DWI only MRI scan for confirmation, with NIR intervention pending. 3. No superimposed hemodynamically significant stenosis in the left carotid or vertebrobasilar system. The right vertebral is dominant and supplies the basilar. Electronically Signed   By: Odessa Fleming M.D.   On: 10/06/2017 11:22   Mr Brain Wo Contrast  Result Date: 10/19/2017 CLINICAL DATA:  84 year old female is poorly responsive with  right ICA, right MCA bifurcation, and right Pcomm/PCA origin large vessel occlusion on CTA. CT perfusion affected by motion artifact, and confounding chronic appearing right greater than left MCA territory infarcts on presentation noncontrast head CT. EXAM: MRI HEAD WITHOUT CONTRAST LIMITED TECHNIQUE: Diffusion and FLAIR pulse sequences of the brain and surrounding structures were obtained without intravenous contrast. COMPARISON:  Head CT, CTA and CT perfusion earlier today. FINDINGS: Axial diffusion weighted imaging demonstrates confluent abnormal signal in the posterior right hemisphere corresponding to the right MCA and right MCA/PCA watershed territory. On ADC much of this appears restricted on diffusion. Superimposed chronic encephalomalacia at the right frontal operculum. Superimposed cortical and subcortical white matter restricted diffusion in the left PCA territory and left PCA/MCA watershed. There is a small area of cortical encephalomalacia at the left posterior sylvian fissure although there appears to be some curvilinear surrounding cortical restricted diffusion or susceptibility artifact. Mildly motion degraded axial FLAIR imaging demonstrates the chronic areas of encephalomalacia with little if any FLAIR hyperintensity corresponding to the restricted diffusion. There is no intracranial mass effect. No ventriculomegaly. Basilar cisterns remain patent. IMPRESSION: 1. Right MCA and MCA/PCA watershed territory restricted diffusion suggesting core infarct beyond that estimated on the CTP earlier today. This ischemia appears concordant with the CTA findings of right ICA, right MCA bifurcation and right Pcomm origin occlusion. 2. Additional contralateral Left PCA and MCA PCA watershed territory restricted diffusion. On review of the earlier CTA, there is a paucity of flow in the distal left P3 branches concordant with this finding, although the left P1, P2 segments and left PCA bifurcation appear normal. 3.  This study discussed by telephone with Dr. Wilford Corner on beginning at 1127 hours. Electronically Signed   By: Odessa Fleming M.D.   On: 10/19/2017 11:51   Ct Cerebral Perfusion W Contrast  Result Date: 10/15/2017 CLINICAL DATA:  Code stroke. 84 year old female found unresponsive with gaze deviation. Family denies knowledge of a previous stroke however, there is bilateral MCA territory ischemia which most resembles chronic encephalomalacia on the plain CT at 1028 hours today. EXAM: CT ANGIOGRAPHY HEAD AND NECK CT PERFUSION BRAIN TECHNIQUE: Multidetector CT imaging of the head and neck was performed using the standard protocol during bolus administration of intravenous contrast. Multiplanar CT image reconstructions and MIPs were obtained to evaluate the vascular anatomy. Carotid stenosis measurements (when applicable) are obtained utilizing NASCET criteria, using the distal internal carotid diameter as the denominator. Multiphase CT imaging of the brain was performed following IV bolus contrast injection. Subsequent parametric perfusion maps were calculated using RAPID software. CONTRAST:  90mL ISOVUE-370 IOPAMIDOL (ISOVUE-370) INJECTION 76% COMPARISON:  Head CT without contrast 1028 hours today. FINDINGS: CT Brain Perfusion Findings: The CTP is significantly degraded by motion artifact despite. This resulted in automatic exclusion of multiple segments from the computer analysis, including a significant portion of the arterial input function (series 1401, image 9). CBF (<30%) Volume: 24 milliliters, predominantly in the posterior right hemisphere, but this could be unreliable due to  motion. Perfusion (Tmax>6.0s) volume: Bilateral cerebellar and cerebral hemisphere abnormal T-max values up to 200 milliliters, some of this is likely artifactual. There is right greater than left MCA territory abnormal T-max. Infarction Location:Posterior right hemisphere. CTA NECK Skeleton: Advanced cervical spine degeneration. No acute osseous  abnormality identified. Upper chest: Mild motion artifact. Negative upper lungs. No superior mediastinal lymphadenopathy. Other neck: Negative; no neck mass or lymphadenopathy. Aortic arch: 3 vessel arch configuration with mild to moderate soft and calcified arch atherosclerosis. Right carotid system: Stenosis. No brachiocephalic or right CCA The right ICA is occluded origin at the distal bulb (series 6, image 94 and series 10, image 87) and remains occluded to the skull base. Left carotid system: Left CCA and proximal left ICA atherosclerosis without significant stenosis. Vertebral arteries: No proximal right subclavian artery stenosis. Mild calcified plaque at the right vertebral artery origin resulting in mild stenosis. Dominant right vertebral artery is patent to the skull base without additional stenosis. No significant proximal left subclavian artery stenosis despite soft and calcified plaque. Soft and calcified plaque near the origin of the non dominant left vertebral artery. Mild associated stenosis. The small left vertebral artery remains patent to the skull base without additional stenosis. CTA HEAD Posterior circulation: The dominant right vertebral artery supplies the basilar. The non dominant left vertebral appears to functionally terminates in PICA. The right PICA origin is patent. No distal right vertebral artery or basilar artery stenosis. Patent basilar tip. Fetal type right PCA origin, the right posterior communicating artery appears partially thrombosed as seen on series 13, image 15. Despite this, the right PCA remains patent. The distal right PCA branches arm only mildly hypoenhancing compared to the left. Normal left PCA origin. Left PCA branches are within normal limits. Anterior circulation: Patent left ICA siphon with no stenosis. Patent left ICA terminus. Normal left MCA and ACA origins. Left MCA M1 segment, bifurcation, and left MCA branches are within normal limits. The right ICA siphon  is occluded, including the anterior portion of the right posterior communicating artery which appears to constitute a fetal type PCA origin. The ACAs and anterior communicating arteries are patent, and probably supplying collateral flow to the right MCA origin. The bilateral ACA branches are within normal limits. The right MCA M1 is patent, but the right MCA bifurcation is partially occluded. Paucity of right MCA M2 and distal branch enhancement (series 13, image 10). Venous sinuses: Patent on the delayed images. Anatomic variants: Dominant right vertebral artery and fetal type right PCA origin. Delayed phase: The abnormal right greater than left MCA territory hypodensity appears stable since 1029 hours. No definite acute cytotoxic edema. No abnormal enhancement identified. Review of the MIP images confirms the above findings IMPRESSION: 1. Positive for large vessel occlusion of the right ICA, and also the right MCA bifurcation. - furthermore, there is partial occlusion of the right Pcomm which constitutes a fetal type right PCA origin. The right PCA remains patent. - there is collateral flow to the right M1 via the anterior communicating artery and ACAs. 2. Study discussed by telephone with Dr. Milon Dikes on 10/07/2017 at 1055 hours, and again at 11:17. The CT perfusion analysis is likely unreliable due to motion resulting in omission of some of the arterial input phase. We discussed that in light of the above CTA findings, I suspect the 24 mL core infarct volume in the right hemisphere may be fairly accurate, with the right occipital lobe involvement explained by the fetal right PCA findings above. Probably  then, much of the remaining right hemisphere is at risk, aside from that affected by what I suspect is a chronic right operculum infarct. Dr. Wilford Corner will attempt a stat DWI only MRI scan for confirmation, with NIR intervention pending. 3. No superimposed hemodynamically significant stenosis in the left carotid  or vertebrobasilar system. The right vertebral is dominant and supplies the basilar. Electronically Signed   By: Odessa Fleming M.D.   On: Oct 23, 2017 11:22   Portable Chest Xray  Result Date: 10/06/2017 CLINICAL DATA:  Stroke, ET tube EXAM: PORTABLE CHEST 1 VIEW COMPARISON:  October 23, 2017 FINDINGS: Endotracheal tube is 4 cm above the carina. NG tube enters the stomach. Cardiomegaly with vascular congestion. Bibasilar opacities have increased, likely atelectasis. Possible small layering effusions. IMPRESSION: Cardiomegaly with vascular congestion. Increasing bibasilar atelectasis.  Suspect small layering effusions. Electronically Signed   By: Charlett Nose M.D.   On: 10/06/2017 08:07   Dg Chest Port 1 View  Result Date: 10-23-2017 CLINICAL DATA:  Intubation EXAM: PORTABLE CHEST 1 VIEW COMPARISON:  None. FINDINGS: Endotracheal tube is 6 cm above the carina. NG tube is in the stomach. Heart is borderline in size. Bilateral perihilar and lower lobe opacities could reflect early edema or infection. No effusions or pneumothorax. Degenerative changes in the shoulders and thoracic spine. Aortic atherosclerosis. IMPRESSION: Borderline heart size. Perihilar and lower lobe airspace opacities bilaterally could reflect edema or infection. Endotracheal tube 6 cm above the carina. Electronically Signed   By: Charlett Nose M.D.   On: 2017/10/23 12:12   Ct Head Code Stroke Wo Contrast  Result Date: 23-Oct-2017 CLINICAL DATA:  Code stroke. 84 year old female found unresponsive with gaze deviation. Family denies knowledge of a previous stroke. EXAM: CT HEAD WITHOUT CONTRAST TECHNIQUE: Contiguous axial images were obtained from the base of the skull through the vertex without intravenous contrast. COMPARISON:  No prior head CT. FINDINGS: Brain: There is a 3-4 centimeter area of hypodensity in the right MCA territory centered at the insula and operculum which has a chronic appearance. Some of this demonstrates dystrophic calcification.  There is no associated mass effect. There is a smaller area of cortical hypodensity along the posterior left sylvian fissure which also has a chronic appearance (sagittal image 43). No associated mass effect. No definite acute cytotoxic edema identified. No intracranial hemorrhage or mass effect. No other cortical encephalomalacia identified. No ventriculomegaly. Patent basilar cisterns. Vascular: Calcified atherosclerosis at the skull base. No suspicious intracranial vascular hyperdensity. Skull: Intermittent motion artifact. No acute osseous abnormality identified. Sinuses/Orbits: Visualized paranasal sinuses and mastoids are well pneumatized. Other: Gaze appears to be midline. Postoperative changes to both globes. No acute scalp soft tissue findings. ASPECTS [facility] Stroke Program Early CT Score) Total score (0-10 with 10 being normal): 10, allowing for bilateral MCA territory chronic appearing encephalomalacia. IMPRESSION: 1. Prior right greater than left MCA territory ischemia which is favored to be chronic. No intracranial hemorrhage, intracranial mass effect, or definite acute cytotoxic edema. 2. This was discussed by telephone with Dr. Wilford Corner on 23-Oct-2017 at 1055 hours. Electronically Signed   By: Odessa Fleming M.D.   On: 10-23-17 10:59   TTE pending  MRI and MRA pending   PHYSICAL EXAM  Temp:  [93.2 F (34 C)-100 F (37.8 C)] 99.9 F (37.7 C) (07/16 0700) Pulse Rate:  [46-83] 68 (07/16 0831) Resp:  [14-26] 24 (07/16 0831) BP: (90-157)/(46-105) 157/62 (07/16 0831) SpO2:  [97 %-100 %] 97 % (07/16 0831) Arterial Line BP: (63-146)/(42-59) 141/52 (07/16 0700) FiO2 (%):  [  40 %-100 %] 40 % (07/16 0831) Weight:  [162 lb 4.1 oz (73.6 kg)-165 lb 5.5 oz (75 kg)] 165 lb 5.5 oz (75 kg) (07/15 1650)  General - Well nourished, well developed, intubated on sedation.  Ophthalmologic - fundi not visualized due to noncooperation.  Cardiovascular - Regular rate and rhythm.  Neuro - intubated on vent, not  open eyes, on propofol for sedation, restless in bed. Not following any commands. PERRL, eyes mid position, doll's eyes sluggish, but able to move to left side but not to right. Absent of corneal reflex bilaterally, but positive gag. Spontaneously move RUE against gravity, RLE on pain stimulation 2+/5. LUE flaccid and LLE 1/5 on pain stimulation. DTR 1+ and no babinski. Sensation, coordination and gait not tested.   ASSESSMENT/PLAN Kathryn Johnson is a 84 y.o. female with history of HTN, anxiety, breast cancer survivor admitted for AMS and left sided weakness. No tPA given due to OSW and possible seizure activity. Underwent IR with TICI2b revascularization.     Stroke:  bilateral infarcts with large right MCA and moderate left MCA and PCA infarcts, embolic pattern, source unclear  Resultant intubated, left hemiplegia  MRI  bilateral infarcts with large right MCA and moderate left MCA and PCA infarcts, old bilateral frontal infarcts, R larger than L  CTA head and neck - right ICA and MCA occlusion, right PCOM occlusion  DSA - RT ICA cavernous to terminus occlusion , and of the dominant RT PCOM supplying both PCAs - s/p TICI2b reperfusion  Repeat CT showed large right MCA infarcts and left occipital infarcts  MRI and MRA repeat pending  2D Echo  pending  LE venous doppler pending  LDL 85  HgbA1c pending  Heparin subq for VTE prophylaxis  NPO  aspirin 81 mg daily prior to admission, now on aspirin 325 mg daily.   Ongoing aggressive stroke risk factor management  Therapy recommendations:  pending  Disposition:  Pending  Family expressed pt living will of no heroic measures  Right ICA and MCA occlusion  Etiology unclear  CTA head and neck - right ICA and MCA occlusion, right PCOM occlusion  DSA - RT ICA cavernous to terminus occlusion , and of the dominant RT PCOM supplying both PCAs - s/p TICI2b reperfusion  Repeat MRA pending  Respiratory failure  Intubated for  IR procedure  CCM on board  Wean as able  Hypotension Stable On neo BP goal 120 -140 after IR procedure  Long term BP goal normotensive  Hyperlipidemia  Home meds:  none   LDL 85, goal < 70  Now on lipitor 20  Continue statin at discharge  Other Stroke Risk Factors  Advanced age  Hx stroke/TIA - on images  Other Active Problems  Anxiety  Breast cancer survivor  Hypokalemia - supplement  Leukocytosis - WBC 13.4 - on Unasyn   Hospital day # 1  This patient is critically ill due to acute stroke, s/p intervention, hypotension, respiratory failure, right MCA occlusion and at significant risk of neurological worsening, death form recurrent stroke, hemorrhagic conversion, seizure, heart failure, cerebral edema. This patient's care requires constant monitoring of vital signs, hemodynamics, respiratory and cardiac monitoring, review of multiple databases, neurological assessment, discussion with family, other specialists and medical decision making of high complexity. I spent 45 minutes of neurocritical care time in the care of this patient. I had long discussion with daughter and son in law at bedside, updated pt current condition, treatment plan and potential prognosis. They expressed understanding  and appreciation.     Marvel Plan, MD PhD Stroke Neurology 10/06/2017 10:57 AM    To contact Stroke Continuity provider, please refer to [email or web address]. After hours, contact General Neurology

## 2017-10-06 NOTE — Progress Notes (Signed)
Patient was transported to CT & back to 4N18 without any complications.

## 2017-10-06 NOTE — Progress Notes (Signed)
*  Preliminary Results* Bilateral lower extremity venous duplex completed. Visualized veins of bilateral lower extremities are negative for deep vein thrombosis.   10/06/2017 11:27 AM Kathryn MilletMegan Clare Gandyiddle

## 2017-10-06 NOTE — Progress Notes (Signed)
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 Corliss Skains. 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 Corliss Skains. Patient laying in bed intubated and sedated. Has spontaneous movements of right side, no spontaneous movements of left side. Right groin incision c/d/i.  CT head 10/06/2017: 1. New large acute infarcts involving much of the mid and posterior right cerebral hemisphere as well as the left occipital lobe. 2. 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 3. No hemorrhage.   Allergies: Patient has no known allergies.  Medications: Prior to Admission medications   Medication Sig Start Date End Date Taking? Authorizing Provider  amLODipine (NORVASC) 5 MG tablet Take 7.5 mg by mouth daily. 07/30/17  Yes [provider]  aspirin EC 81 MG tablet Take 81 mg by mouth daily.   Yes [provider]  AZO-CRANBERRY PO Take 1 tablet by mouth daily.   Yes [provider]  calcium carbonate (OSCAL) 1500 (600 Ca) MG TABS tablet Take 1,500 mg by mouth 2 (two) times daily with a meal.   Yes [provider]  Cholecalciferol (VITAMIN D PO) Take 1 tablet by mouth daily.   Yes [provider]  enalapril (VASOTEC) 20 MG tablet Take 20 mg by mouth 2 (two) times daily. 09/29/17  Yes [provider]  folic acid (FOLVITE) 400 MCG tablet Take 400 mcg by mouth daily.   Yes [provider]  hydrochlorothiazide (HYDRODIURIL) 25 MG tablet Take 25 mg by mouth every morning. 09/19/17  Yes [provider]  LORazepam (ATIVAN) 0.5 MG tablet Take 0.25 mg by mouth every 8 (eight) hours as needed for anxiety.   Yes [provider]  metoprolol succinate (TOPROL-XL) 100 MG 24 hr tablet Take 100 mg by mouth daily. 08/18/17   Yes [provider]  metoprolol succinate (TOPROL-XL) 25 MG 24 hr tablet Take 25 mg by mouth daily. 08/18/17  Yes [provider]  Multiple Vitamins-Minerals (PROTEGRA PO) Take 1-2 capsules by mouth daily.   Yes [provider]  Probiotic Product (PROBIOTIC PO) Take 1 capsule by mouth daily.   Yes [provider]  psyllium (HYDROCIL/METAMUCIL) 95 % PACK Take 1 packet by mouth daily.   Yes [provider]  pyridOXINE (VITAMIN B-6) 50 MG tablet Take 50 mg by mouth daily.   Yes [provider]     Vital Signs: BP (!) 157/62   Pulse 68   Temp 99.9 F (37.7 C)   Resp (!) 24   Ht  (1.626 m)   Wt 165 lb 5.5 oz (75 kg)   SpO2 97%   BMI 28.38 kg/m   Physical Exam  Constitutional: She appears well-developed and well-nourished. No distress.  Intubated and sedated.  Pulmonary/Chest: Effort normal and breath sounds normal. No respiratory distress. She has no wheezes.  Intubated and sedated.  Neurological:  Intubated and sedated. Speech and comprehension not assessed. PERRL and sluggish bilaterally. EOMs not assessed. Visual fields not assessed. No facial asymmetry. Tongue protrusion not assessed. Demonstrates spontaneous movements of right extremities but no spontaneous movements of left extremities. Pronator drift not assessed. Fine motor and coordination not assessed. Gait not assessed. Romberg not assessed. Heel to toe not assessed. Distal pulses 2+ bilaterally.  Skin: Skin is warm and dry.  Right groin incision soft without active bleeding or hematoma.  Psychiatric:  Intubated and sedated.  Nursing note and vitals reviewed.   Imaging: Ct Angio Head W Or Wo Contrast  Result Date: 10/02/2017 CLINICAL DATA:  Code stroke. 84 year old female found unresponsive with gaze deviation. Family denies knowledge of a previous stroke however, there is bilateral MCA territory ischemia which most resembles chronic encephalomalacia on  the plain CT at 1028 hours today. EXAM: CT ANGIOGRAPHY HEAD AND NECK CT PERFUSION BRAIN TECHNIQUE: Multidetector CT imaging of the head and neck was performed using the standard protocol during bolus administration of intravenous contrast. Multiplanar CT image reconstructions and MIPs were obtained to evaluate the vascular anatomy. Carotid stenosis measurements (when applicable) are obtained utilizing NASCET criteria, using the distal internal carotid diameter as the denominator. Multiphase CT imaging of the brain was performed following IV bolus contrast injection. Subsequent parametric perfusion maps were calculated using RAPID software. CONTRAST:  90mL ISOVUE-370 IOPAMIDOL (ISOVUE-370) INJECTION 76% COMPARISON:  Head CT without contrast 1028 hours today. FINDINGS: CT Brain Perfusion Findings: The CTP is significantly degraded by motion artifact despite. This resulted in automatic exclusion of multiple segments from the computer analysis, including a significant portion of the arterial input function (series 1401, image 9). CBF (<30%) Volume: 24 milliliters, predominantly in the posterior right hemisphere, but this could be unreliable due to motion. Perfusion (Tmax>6.0s) volume: Bilateral cerebellar and cerebral hemisphere abnormal T-max values up to 200 milliliters, some of this is likely artifactual. There is right greater than left MCA territory abnormal T-max. Infarction Location:Posterior right hemisphere. CTA NECK Skeleton: Advanced cervical spine degeneration. No acute osseous abnormality identified. Upper chest: Mild motion artifact. Negative upper lungs. No superior mediastinal lymphadenopathy. Other neck: Negative; no neck mass or lymphadenopathy. Aortic arch: 3 vessel arch configuration with mild to moderate soft and calcified arch atherosclerosis. Right carotid system: Stenosis. No brachiocephalic or right CCA The right ICA is occluded origin at the distal bulb (series 6, image 94 and series 10, image  87) and remains occluded to the skull base. Left carotid system: Left CCA and proximal left ICA atherosclerosis without significant stenosis. Vertebral arteries: No proximal right subclavian artery stenosis. Mild calcified plaque at the right vertebral artery origin resulting in mild stenosis. Dominant right vertebral artery is patent to the skull base without additional stenosis. No significant proximal left subclavian artery stenosis despite soft and calcified plaque. Soft and calcified plaque near the origin of the non dominant left vertebral artery. Mild associated stenosis. The small left vertebral artery remains patent to the skull base without additional stenosis. CTA HEAD Posterior circulation: The dominant right vertebral artery supplies the basilar. The non dominant left vertebral appears to functionally terminates in PICA. The right PICA origin is patent. No distal right vertebral artery or basilar artery stenosis. Patent basilar tip. Fetal type right PCA origin, the right posterior communicating artery appears partially thrombosed as seen on series 13, image 15. Despite this, the right PCA remains patent. The distal right PCA branches arm only mildly hypoenhancing compared to the left. Normal left PCA origin. Left PCA branches are within normal limits. Anterior circulation: Patent left ICA siphon with no stenosis. Patent left ICA terminus. Normal left MCA and ACA origins. Left MCA M1 segment, bifurcation, and left MCA branches are within normal limits. The right ICA siphon is occluded, including the anterior portion of the right posterior communicating artery which appears to constitute a fetal type PCA origin. The ACAs and anterior communicating arteries are patent, and probably supplying collateral flow to the right MCA origin. The bilateral ACA  branches are within normal limits. The right MCA M1 is patent, but the right MCA bifurcation is partially occluded. Paucity of right MCA M2 and distal branch  enhancement (series 13, image 10). Venous sinuses: Patent on the delayed images. Anatomic variants: Dominant right vertebral artery and fetal type right PCA origin. Delayed phase: The abnormal right greater than left MCA territory hypodensity appears stable since 1029 hours. No definite acute cytotoxic edema. No abnormal enhancement identified. Review of the MIP images confirms the above findings IMPRESSION: 1. Positive for large vessel occlusion of the right ICA, and also the right MCA bifurcation. - furthermore, there is partial occlusion of the right Pcomm which constitutes a fetal type right PCA origin. The right PCA remains patent. - there is collateral flow to the right M1 via the anterior communicating artery and ACAs. 2. Study discussed by telephone with Dr. Milon Dikes on 10/19/2017 at 1055 hours, and again at 11:17. The CT perfusion analysis is likely unreliable due to motion resulting in omission of some of the arterial input phase. We discussed that in light of the above CTA findings, I suspect the 24 mL core infarct volume in the right hemisphere may be fairly accurate, with the right occipital lobe involvement explained by the fetal right PCA findings above. Probably then, much of the remaining right hemisphere is at risk, aside from that affected by what I suspect is a chronic right operculum infarct. Dr. Wilford Corner will attempt a stat DWI only MRI scan for confirmation, with NIR intervention pending. 3. No superimposed hemodynamically significant stenosis in the left carotid or vertebrobasilar system. The right vertebral is dominant and supplies the basilar. Electronically Signed   By: Odessa Fleming M.D.   On: 09/26/2017 11:22   Ct Head Wo Contrast  Result Date: 10/06/2017 CLINICAL DATA:  Altered level of consciousness EXAM: CT HEAD WITHOUT CONTRAST TECHNIQUE: Contiguous axial images were obtained from the base of the skull through the vertex without intravenous contrast. COMPARISON:  10/15/2017 FINDINGS:  Brain: Motion artifact degrades image quality. There is new diffuse low-density noted throughout much of the right cerebral hemisphere, most notable in the temporal, parietal and occipital lobes. New low-density also noted posteriorly in the left occipital lobe and posterior parietal lobe. Findings are compatible with acute infarctions. Old infarcts noted in the right frontotemporal lobe with associated parenchymal calcifications and in the left posterior frontal lobe. No hydrocephalus or hemorrhage. Vascular: No evidence of aneurysm or adenopathy. Skull: No acute calvarial abnormality. Sinuses/Orbits: Visualized paranasal sinuses and mastoids clear. Orbital soft tissues unremarkable. Other: None IMPRESSION: New large acute infarcts involving much of the mid and posterior right cerebral hemisphere as well as the left occipital lobe. 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 No hemorrhage. Electronically Signed   By: Charlett Nose M.D.   On: 10/06/2017 08:32   Ct Angio Neck W Or Wo Contrast  Result Date: 09/27/2017 CLINICAL DATA:  Code stroke. 84 year old female found unresponsive with gaze deviation. Family denies knowledge of a previous stroke however, there is bilateral MCA territory ischemia which most resembles chronic encephalomalacia on the plain CT at 1028 hours today. EXAM: CT ANGIOGRAPHY HEAD AND NECK CT PERFUSION BRAIN TECHNIQUE: Multidetector CT imaging of the head and neck was performed using the standard protocol during bolus administration of intravenous contrast. Multiplanar CT image reconstructions and MIPs were obtained to evaluate the vascular anatomy. Carotid stenosis measurements (when applicable) are obtained utilizing NASCET criteria, using the distal internal carotid diameter as the denominator. Multiphase CT  imaging of the brain was performed following IV bolus contrast injection. Subsequent parametric perfusion maps were calculated using RAPID software. CONTRAST:  90mL  ISOVUE-370 IOPAMIDOL (ISOVUE-370) INJECTION 76% COMPARISON:  Head CT without contrast 1028 hours today. FINDINGS: CT Brain Perfusion Findings: The CTP is significantly degraded by motion artifact despite. This resulted in automatic exclusion of multiple segments from the computer analysis, including a significant portion of the arterial input function (series 1401, image 9). CBF (<30%) Volume: 24 milliliters, predominantly in the posterior right hemisphere, but this could be unreliable due to motion. Perfusion (Tmax>6.0s) volume: Bilateral cerebellar and cerebral hemisphere abnormal T-max values up to 200 milliliters, some of this is likely artifactual. There is right greater than left MCA territory abnormal T-max. Infarction Location:Posterior right hemisphere. CTA NECK Skeleton: Advanced cervical spine degeneration. No acute osseous abnormality identified. Upper chest: Mild motion artifact. Negative upper lungs. No superior mediastinal lymphadenopathy. Other neck: Negative; no neck mass or lymphadenopathy. Aortic arch: 3 vessel arch configuration with mild to moderate soft and calcified arch atherosclerosis. Right carotid system: Stenosis. No brachiocephalic or right CCA The right ICA is occluded origin at the distal bulb (series 6, image 94 and series 10, image 87) and remains occluded to the skull base. Left carotid system: Left CCA and proximal left ICA atherosclerosis without significant stenosis. Vertebral arteries: No proximal right subclavian artery stenosis. Mild calcified plaque at the right vertebral artery origin resulting in mild stenosis. Dominant right vertebral artery is patent to the skull base without additional stenosis. No significant proximal left subclavian artery stenosis despite soft and calcified plaque. Soft and calcified plaque near the origin of the non dominant left vertebral artery. Mild associated stenosis. The small left vertebral artery remains patent to the skull base without  additional stenosis. CTA HEAD Posterior circulation: The dominant right vertebral artery supplies the basilar. The non dominant left vertebral appears to functionally terminates in PICA. The right PICA origin is patent. No distal right vertebral artery or basilar artery stenosis. Patent basilar tip. Fetal type right PCA origin, the right posterior communicating artery appears partially thrombosed as seen on series 13, image 15. Despite this, the right PCA remains patent. The distal right PCA branches arm only mildly hypoenhancing compared to the left. Normal left PCA origin. Left PCA branches are within normal limits. Anterior circulation: Patent left ICA siphon with no stenosis. Patent left ICA terminus. Normal left MCA and ACA origins. Left MCA M1 segment, bifurcation, and left MCA branches are within normal limits. The right ICA siphon is occluded, including the anterior portion of the right posterior communicating artery which appears to constitute a fetal type PCA origin. The ACAs and anterior communicating arteries are patent, and probably supplying collateral flow to the right MCA origin. The bilateral ACA branches are within normal limits. The right MCA M1 is patent, but the right MCA bifurcation is partially occluded. Paucity of right MCA M2 and distal branch enhancement (series 13, image 10). Venous sinuses: Patent on the delayed images. Anatomic variants: Dominant right vertebral artery and fetal type right PCA origin. Delayed phase: The abnormal right greater than left MCA territory hypodensity appears stable since 1029 hours. No definite acute cytotoxic edema. No abnormal enhancement identified. Review of the MIP images confirms the above findings IMPRESSION: 1. Positive for large vessel occlusion of the right ICA, and also the right MCA bifurcation. - furthermore, there is partial occlusion of the right Pcomm which constitutes a fetal type right PCA origin. The right PCA remains patent. -  there is  collateral flow to the right M1 via the anterior communicating artery and ACAs. 2. Study discussed by telephone with Dr. Milon Dikes on 10/07/2017 at 1055 hours, and again at 11:17. The CT perfusion analysis is likely unreliable due to motion resulting in omission of some of the arterial input phase. We discussed that in light of the above CTA findings, I suspect the 24 mL core infarct volume in the right hemisphere may be fairly accurate, with the right occipital lobe involvement explained by the fetal right PCA findings above. Probably then, much of the remaining right hemisphere is at risk, aside from that affected by what I suspect is a chronic right operculum infarct. Dr. Wilford Corner will attempt a stat DWI only MRI scan for confirmation, with NIR intervention pending. 3. No superimposed hemodynamically significant stenosis in the left carotid or vertebrobasilar system. The right vertebral is dominant and supplies the basilar. Electronically Signed   By: Odessa Fleming M.D.   On: 07-Oct-2017 11:22   Mr Brain Wo Contrast  Result Date: October 07, 2017 CLINICAL DATA:  84 year old female is poorly responsive with right ICA, right MCA bifurcation, and right Pcomm/PCA origin large vessel occlusion on CTA. CT perfusion affected by motion artifact, and confounding chronic appearing right greater than left MCA territory infarcts on presentation noncontrast head CT. EXAM: MRI HEAD WITHOUT CONTRAST LIMITED TECHNIQUE: Diffusion and FLAIR pulse sequences of the brain and surrounding structures were obtained without intravenous contrast. COMPARISON:  Head CT, CTA and CT perfusion earlier today. FINDINGS: Axial diffusion weighted imaging demonstrates confluent abnormal signal in the posterior right hemisphere corresponding to the right MCA and right MCA/PCA watershed territory. On ADC much of this appears restricted on diffusion. Superimposed chronic encephalomalacia at the right frontal operculum. Superimposed cortical and subcortical  white matter restricted diffusion in the left PCA territory and left PCA/MCA watershed. There is a small area of cortical encephalomalacia at the left posterior sylvian fissure although there appears to be some curvilinear surrounding cortical restricted diffusion or susceptibility artifact. Mildly motion degraded axial FLAIR imaging demonstrates the chronic areas of encephalomalacia with little if any FLAIR hyperintensity corresponding to the restricted diffusion. There is no intracranial mass effect. No ventriculomegaly. Basilar cisterns remain patent. IMPRESSION: 1. Right MCA and MCA/PCA watershed territory restricted diffusion suggesting core infarct beyond that estimated on the CTP earlier today. This ischemia appears concordant with the CTA findings of right ICA, right MCA bifurcation and right Pcomm origin occlusion. 2. Additional contralateral Left PCA and MCA PCA watershed territory restricted diffusion. On review of the earlier CTA, there is a paucity of flow in the distal left P3 branches concordant with this finding, although the left P1, P2 segments and left PCA bifurcation appear normal. 3. This study discussed by telephone with Dr. Wilford Corner on beginning at 1127 hours. Electronically Signed   By: Odessa Fleming M.D.   On: 10-07-17 11:51   Ct Cerebral Perfusion W Contrast  Result Date: 2017/10/07 CLINICAL DATA:  Code stroke. 84 year old female found unresponsive with gaze deviation. Family denies knowledge of a previous stroke however, there is bilateral MCA territory ischemia which most resembles chronic encephalomalacia on the plain CT at 1028 hours today. EXAM: CT ANGIOGRAPHY HEAD AND NECK CT PERFUSION BRAIN TECHNIQUE: Multidetector CT imaging of the head and neck was performed using the standard protocol during bolus administration of intravenous contrast. Multiplanar CT image reconstructions and MIPs were obtained to evaluate the vascular anatomy. Carotid stenosis measurements (when applicable) are  obtained utilizing NASCET criteria, using  the distal internal carotid diameter as the denominator. Multiphase CT imaging of the brain was performed following IV bolus contrast injection. Subsequent parametric perfusion maps were calculated using RAPID software. CONTRAST:  90mL ISOVUE-370 IOPAMIDOL (ISOVUE-370) INJECTION 76% COMPARISON:  Head CT without contrast 1028 hours today. FINDINGS: CT Brain Perfusion Findings: The CTP is significantly degraded by motion artifact despite. This resulted in automatic exclusion of multiple segments from the computer analysis, including a significant portion of the arterial input function (series 1401, image 9). CBF (<30%) Volume: 24 milliliters, predominantly in the posterior right hemisphere, but this could be unreliable due to motion. Perfusion (Tmax>6.0s) volume: Bilateral cerebellar and cerebral hemisphere abnormal T-max values up to 200 milliliters, some of this is likely artifactual. There is right greater than left MCA territory abnormal T-max. Infarction Location:Posterior right hemisphere. CTA NECK Skeleton: Advanced cervical spine degeneration. No acute osseous abnormality identified. Upper chest: Mild motion artifact. Negative upper lungs. No superior mediastinal lymphadenopathy. Other neck: Negative; no neck mass or lymphadenopathy. Aortic arch: 3 vessel arch configuration with mild to moderate soft and calcified arch atherosclerosis. Right carotid system: Stenosis. No brachiocephalic or right CCA The right ICA is occluded origin at the distal bulb (series 6, image 94 and series 10, image 87) and remains occluded to the skull base. Left carotid system: Left CCA and proximal left ICA atherosclerosis without significant stenosis. Vertebral arteries: No proximal right subclavian artery stenosis. Mild calcified plaque at the right vertebral artery origin resulting in mild stenosis. Dominant right vertebral artery is patent to the skull base without additional stenosis. No  significant proximal left subclavian artery stenosis despite soft and calcified plaque. Soft and calcified plaque near the origin of the non dominant left vertebral artery. Mild associated stenosis. The small left vertebral artery remains patent to the skull base without additional stenosis. CTA HEAD Posterior circulation: The dominant right vertebral artery supplies the basilar. The non dominant left vertebral appears to functionally terminates in PICA. The right PICA origin is patent. No distal right vertebral artery or basilar artery stenosis. Patent basilar tip. Fetal type right PCA origin, the right posterior communicating artery appears partially thrombosed as seen on series 13, image 15. Despite this, the right PCA remains patent. The distal right PCA branches arm only mildly hypoenhancing compared to the left. Normal left PCA origin. Left PCA branches are within normal limits. Anterior circulation: Patent left ICA siphon with no stenosis. Patent left ICA terminus. Normal left MCA and ACA origins. Left MCA M1 segment, bifurcation, and left MCA branches are within normal limits. The right ICA siphon is occluded, including the anterior portion of the right posterior communicating artery which appears to constitute a fetal type PCA origin. The ACAs and anterior communicating arteries are patent, and probably supplying collateral flow to the right MCA origin. The bilateral ACA branches are within normal limits. The right MCA M1 is patent, but the right MCA bifurcation is partially occluded. Paucity of right MCA M2 and distal branch enhancement (series 13, image 10). Venous sinuses: Patent on the delayed images. Anatomic variants: Dominant right vertebral artery and fetal type right PCA origin. Delayed phase: The abnormal right greater than left MCA territory hypodensity appears stable since 1029 hours. No definite acute cytotoxic edema. No abnormal enhancement identified. Review of the MIP images confirms the  above findings IMPRESSION: 1. Positive for large vessel occlusion of the right ICA, and also the right MCA bifurcation. - furthermore, there is partial occlusion of the right Pcomm which constitutes a fetal  type right PCA origin. The right PCA remains patent. - there is collateral flow to the right M1 via the anterior communicating artery and ACAs. 2. Study discussed by telephone with Dr. Milon Dikes on 10/17/2017 at 1055 hours, and again at 11:17. The CT perfusion analysis is likely unreliable due to motion resulting in omission of some of the arterial input phase. We discussed that in light of the above CTA findings, I suspect the 24 mL core infarct volume in the right hemisphere may be fairly accurate, with the right occipital lobe involvement explained by the fetal right PCA findings above. Probably then, much of the remaining right hemisphere is at risk, aside from that affected by what I suspect is a chronic right operculum infarct. Dr. Wilford Corner will attempt a stat DWI only MRI scan for confirmation, with NIR intervention pending. 3. No superimposed hemodynamically significant stenosis in the left carotid or vertebrobasilar system. The right vertebral is dominant and supplies the basilar. Electronically Signed   By: Odessa Fleming M.D.   On: 09/28/2017 11:22   Portable Chest Xray  Result Date: 10/06/2017 CLINICAL DATA:  Stroke, ET tube EXAM: PORTABLE CHEST 1 VIEW COMPARISON:  10/06/2017 FINDINGS: Endotracheal tube is 4 cm above the carina. NG tube enters the stomach. Cardiomegaly with vascular congestion. Bibasilar opacities have increased, likely atelectasis. Possible small layering effusions. IMPRESSION: Cardiomegaly with vascular congestion. Increasing bibasilar atelectasis.  Suspect small layering effusions. Electronically Signed   By: Charlett Nose M.D.   On: 10/06/2017 08:07   Dg Chest Port 1 View  Result Date: 09/22/2017 CLINICAL DATA:  Intubation EXAM: PORTABLE CHEST 1 VIEW COMPARISON:  None. FINDINGS:  Endotracheal tube is 6 cm above the carina. NG tube is in the stomach. Heart is borderline in size. Bilateral perihilar and lower lobe opacities could reflect early edema or infection. No effusions or pneumothorax. Degenerative changes in the shoulders and thoracic spine. Aortic atherosclerosis. IMPRESSION: Borderline heart size. Perihilar and lower lobe airspace opacities bilaterally could reflect edema or infection. Endotracheal tube 6 cm above the carina. Electronically Signed   By: Charlett Nose M.D.   On: October 08, 2017 12:12   Ct Head Code Stroke Wo Contrast  Result Date: October 08, 2017 CLINICAL DATA:  Code stroke. 84 year old female found unresponsive with gaze deviation. Family denies knowledge of a previous stroke. EXAM: CT HEAD WITHOUT CONTRAST TECHNIQUE: Contiguous axial images were obtained from the base of the skull through the vertex without intravenous contrast. COMPARISON:  No prior head CT. FINDINGS: Brain: There is a 3-4 centimeter area of hypodensity in the right MCA territory centered at the insula and operculum which has a chronic appearance. Some of this demonstrates dystrophic calcification. There is no associated mass effect. There is a smaller area of cortical hypodensity along the posterior left sylvian fissure which also has a chronic appearance (sagittal image 43). No associated mass effect. No definite acute cytotoxic edema identified. No intracranial hemorrhage or mass effect. No other cortical encephalomalacia identified. No ventriculomegaly. Patent basilar cisterns. Vascular: Calcified atherosclerosis at the skull base. No suspicious intracranial vascular hyperdensity. Skull: Intermittent motion artifact. No acute osseous abnormality identified. Sinuses/Orbits: Visualized paranasal sinuses and mastoids are well pneumatized. Other: Gaze appears to be midline. Postoperative changes to both globes. No acute scalp soft tissue findings. ASPECTS [facility] Stroke Program Early CT Score) Total  score (0-10 with 10 being normal): 10, allowing for bilateral MCA territory chronic appearing encephalomalacia. IMPRESSION: 1. Prior right greater than left MCA territory ischemia which is favored to be chronic. No  intracranial hemorrhage, intracranial mass effect, or definite acute cytotoxic edema. 2. This was discussed by telephone with Dr. Wilford Corner on 10/26/17 at 1055 hours. Electronically Signed   By: Odessa Fleming M.D.   On: 10/26/17 10:59    Labs:  CBC: Recent Labs    2017-10-26 1011 10/26/2017 1017 10/06/17 0315  WBC 14.5*  --  13.4*  HGB 12.9 15.3* 9.3*  HCT 39.2 45.0 29.2*  PLT 365  --  148*    COAGS: Recent Labs    10/26/2017 1011  INR 1.04  APTT 31    BMP: Recent Labs    10-26-17 1011 10/26/2017 1017 October 26, 2017 1711 10/06/17 0315  NA 129* 128* 131* 134*  K 3.0* 3.0* 2.4* 3.1*  CL 91* 91* 99 103  CO2 25  --  23 23  GLUCOSE 125* 125* 113* 101*  BUN 15 16 11 10   CALCIUM 9.5  --  7.0* 7.4*  CREATININE 0.64 0.50 0.53 0.57  GFRNONAA >60  --  >60 >60  GFRAA >60  --  >60 >60    LIVER FUNCTION TESTS: Recent Labs    Oct 26, 2017 1011 October 26, 2017 1711 10/06/17 0315  BILITOT 1.7*  --   --   AST 23  --   --   ALT 19  --   --   ALKPHOS 62  --   --   PROT 7.6  --   --   ALBUMIN 4.2 2.8* 2.8*    Assessment and Plan:  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 Corliss Skains. 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 Corliss Skains. Patient's condition improving- has spontaneous movements of right side but no spontaneous movements of left side. Right groin incision stable. Plan for MRI/MRA brain/head tomorrow. Appreciate and agree with neurology management.  Electronically Signed: Elwin Mocha, PA-C 10/06/2017, 11:06 AM   I spent a total of 15 Minutes at the the patient's bedside AND on the patient's hospital floor or unit, greater than 50% of which was counseling/coordinating care for right ICA cavernous to terminus  occlusion s/p revascularization AND right PCOM artery occlusion s/p revascularization.

## 2017-10-06 NOTE — Progress Notes (Signed)
SLP Cancellation Note  Patient Details Name: Kathryn Johnson MRN: [id number] DOB: 08-16-1930   Cancelled treatment:       Reason Eval/Treat Not Completed: Medical issues which prohibited therapy. Pt intubated   Jadrien Narine, Riley NearingBonnie Caroline 10/06/2017, 7:45 AM

## 2017-10-06 NOTE — Progress Notes (Signed)
PT Cancellation Note  Patient Details Name: Kathryn LivingsMarion Bradwell MRN: [id number] DOB: [birth year]   Cancelled Treatment:    Reason Eval/Treat Not Completed: Active bedrest order. Per OT, RN request therapies hold until 7/17. Will attempt PT eval tomorrow as able.    Marylynn PearsonLaura D Nyasia Baxley 10/06/2017, 9:33 AM   Conni SlipperLaura Dealie Koelzer, PT, DPT Acute Rehabilitation Services Pager: [phone]

## 2017-10-06 NOTE — Progress Notes (Signed)
OT Cancellation Note  Patient Details Name: Kathryn LivingsMarion Johnson MRN: [id number] DOB: 05-31-1930   Cancelled Treatment:    Reason Eval/Treat Not Completed: Patient not medically ready(stat CT 8am/ bedrest til 8am/RN request hold until 7/17)  Felecia ShellingJones, Briza Bark B   Vita Currin, Brynn   OTR/L Pager: [phone] Office: [phone] .  10/06/2017, 8:59 AM

## 2017-10-06 NOTE — Progress Notes (Signed)
PULMONARY / CRITICAL CARE MEDICINE   Name: Kathryn Johnson MRN: [id number] DOB: [birth year]    ADMISSION DATE:  November 02, 2017 CONSULTATION DATE:  2017/11/02  REFERRING MD:  Wilford Corner  CHIEF COMPLAINT:  Left hemiplegia  HISTORY OF PRESENT ILLNESS:     84 year old female with PMH significant for HTN and anxiety, who presented for altered mental status and left sided weakness.   Initially thought LSW was today at 0915 when patient awoke, however family later clarified that she woke up anxious and went to bed normal around 2200.  Family found patient sitting on toilet staring forward and noted to have defecated on herself.  She presented as a code stroke.  CTA head and neck showed right ICA occlusion age-indeterminate and right MCA occlusion. CT perfusion was motion degraded, unable to   Taken for MRI brain which showed a large right hemispherical stroke in the right MCA and right PCA territory along with a left MCA/ PCA watershed and left PCA stroke.  Patient taken for diagnostic cerebral angiogram under general anesthesia where revascularization was completed to the right ICA and right PCA.    She was transiently hypothermic on arrival in the ICU yesterday evening but now is no longer being warmed and is maintaining normothermia.  I held propofol for my examination this morning and she is not at all interactive.    PAST MEDICAL HISTORY :  She  has no past medical history on file.  PAST SURGICAL HISTORY: She  has a past surgical history that includes Breast excisional biopsy (Right) and Breast lumpectomy (Right).  No Known Allergies  No current facility-administered medications on file prior to encounter.    Current Outpatient Medications on File Prior to Encounter  Medication Sig  . amLODipine (NORVASC) 5 MG tablet Take 7.5 mg by mouth daily.  Marland Kitchen aspirin EC 81 MG tablet Take 81 mg by mouth daily.  . AZO-CRANBERRY PO Take 1 tablet by mouth daily.  . calcium carbonate (OSCAL) 1500 (600 Ca) MG  TABS tablet Take 1,500 mg by mouth 2 (two) times daily with a meal.  . Cholecalciferol (VITAMIN D PO) Take 1 tablet by mouth daily.  . enalapril (VASOTEC) 20 MG tablet Take 20 mg by mouth 2 (two) times daily.  . folic acid (FOLVITE) 400 MCG tablet Take 400 mcg by mouth daily.  . hydrochlorothiazide (HYDRODIURIL) 25 MG tablet Take 25 mg by mouth every morning.  Marland Kitchen LORazepam (ATIVAN) 0.5 MG tablet Take 0.25 mg by mouth every 8 (eight) hours as needed for anxiety.  . metoprolol succinate (TOPROL-XL) 100 MG 24 hr tablet Take 100 mg by mouth daily.  . metoprolol succinate (TOPROL-XL) 25 MG 24 hr tablet Take 25 mg by mouth daily.  . Multiple Vitamins-Minerals (PROTEGRA PO) Take 1-2 capsules by mouth daily.  . Probiotic Product (PROBIOTIC PO) Take 1 capsule by mouth daily.  . psyllium (HYDROCIL/METAMUCIL) 95 % PACK Take 1 packet by mouth daily.  Marland Kitchen pyridOXINE (VITAMIN B-6) 50 MG tablet Take 50 mg by mouth daily.    FAMILY HISTORY:  Her family history includes Aneurysm in her mother; Heart disease in her brother.  SOCIAL HISTORY: She    REVIEW OF SYSTEMS:   Unobtainable  SUBJECTIVE:  As above  VITAL SIGNS: BP (!) 111/51   Pulse 63   Temp 99.9 F (37.7 C)   Resp (!) 21   Ht 5\' 4"  (1.626 m)   Wt 165 lb 5.5 oz (75 kg)   SpO2 99%   BMI 28.38  kg/m   HEMODYNAMICS:    VENTILATOR SETTINGS: Vent Mode: PRVC FiO2 (%):  [40 %-100 %] 40 % Set Rate:  [14 bmp] 14 bmp Vt Set:  [440 mL] 440 mL PEEP:  [5 cmH20] 5 cmH20 Plateau Pressure:  [12 cmH20-18 cmH20] 15 cmH20  INTAKE / OUTPUT: I/O last 3 completed shifts: In: 3853.3 [I.V.:3167.8; IV Piggyback:685.5] Out: 1752 [Urine:1602; Blood:150]  PHYSICAL EXAMINATION: General: Elderly female who is orally intubated and mechanically ventilated. Neuro: No response to voice or loud noise with propofol held.  Grimaces to sternal rub but does not move extremities.  Weak corneal on the left.  Pupils equal. Cardiovascular: S1 and S2 are regular  without murmur rub or gallop.  There is no JVD.  There is no dependent edema. Lungs: Respirations are unlabored, she is breathing above the set ventilator rate.  There is symmetric air movement, very few scattered rhonchi and no wheezes. Abdomen: The abdomen is soft without any organomegaly masses tenderness guarding or rebound Musculoskeletal: Limbs are pink and warm   LABS:  BMET Recent Labs  Lab 10/18/2017 1011 10/09/2017 1017 10/18/2017 1711 10/06/17 0315  NA 129* 128* 131* 134*  K 3.0* 3.0* 2.4* 3.1*  CL 91* 91* 99 103  CO2 25  --  23 23  BUN 15 16 11 10   CREATININE 0.64 0.50 0.53 0.57  GLUCOSE 125* 125* 113* 101*    Electrolytes Recent Labs  Lab 10/04/2017 1011 09/25/2017 1711 10/06/17 0315  CALCIUM 9.5 7.0* 7.4*  MG  --  1.3*  --   PHOS  --  2.8 2.3*    CBC Recent Labs  Lab 09/23/2017 1011 09/29/2017 1017 10/06/17 0315  WBC 14.5*  --  13.4*  HGB 12.9 15.3* 9.3*  HCT 39.2 45.0 29.2*  PLT 365  --  148*    Coag's Recent Labs  Lab 10/10/2017 1011  APTT 31  INR 1.04    Sepsis Markers Recent Labs  Lab 09/30/2017 1711 10/06/17 0315  PROCALCITON <0.10 <0.10    ABG Recent Labs  Lab 10/06/2017 1724  PHART 7.377  PCO2ART 37.9  PO2ART 91.0    Liver Enzymes Recent Labs  Lab 10/03/2017 1011 09/26/2017 1711 10/06/17 0315  AST 23  --   --   ALT 19  --   --   ALKPHOS 62  --   --   BILITOT 1.7*  --   --   ALBUMIN 4.2 2.8* 2.8*    Cardiac Enzymes No results for input(s): TROPONINI, PROBNP in the last 168 hours.  Glucose Recent Labs  Lab 10/21/2017 1010 10/20/2017 1930 09/26/2017 2312 10/06/17 0324  GLUCAP 132* 106* 88 97    Imaging Ct Angio Head W Or Wo Contrast  Result Date: 10/11/2017 CLINICAL DATA:  Code stroke. 84 year old female found unresponsive with gaze deviation. Family denies knowledge of a previous stroke however, there is bilateral MCA territory ischemia which most resembles chronic encephalomalacia on the plain CT at 1028 hours today. EXAM: CT  ANGIOGRAPHY HEAD AND NECK CT PERFUSION BRAIN TECHNIQUE: Multidetector CT imaging of the head and neck was performed using the standard protocol during bolus administration of intravenous contrast. Multiplanar CT image reconstructions and MIPs were obtained to evaluate the vascular anatomy. Carotid stenosis measurements (when applicable) are obtained utilizing NASCET criteria, using the distal internal carotid diameter as the denominator. Multiphase CT imaging of the brain was performed following IV bolus contrast injection. Subsequent parametric perfusion maps were calculated using RAPID software. CONTRAST:  90mL ISOVUE-370 IOPAMIDOL (ISOVUE-370) INJECTION  76% COMPARISON:  Head CT without contrast 1028 hours today. FINDINGS: CT Brain Perfusion Findings: The CTP is significantly degraded by motion artifact despite. This resulted in automatic exclusion of multiple segments from the computer analysis, including a significant portion of the arterial input function (series 1401, image 9). CBF (<30%) Volume: 24 milliliters, predominantly in the posterior right hemisphere, but this could be unreliable due to motion. Perfusion (Tmax>6.0s) volume: Bilateral cerebellar and cerebral hemisphere abnormal T-max values up to 200 milliliters, some of this is likely artifactual. There is right greater than left MCA territory abnormal T-max. Infarction Location:Posterior right hemisphere. CTA NECK Skeleton: Advanced cervical spine degeneration. No acute osseous abnormality identified. Upper chest: Mild motion artifact. Negative upper lungs. No superior mediastinal lymphadenopathy. Other neck: Negative; no neck mass or lymphadenopathy. Aortic arch: 3 vessel arch configuration with mild to moderate soft and calcified arch atherosclerosis. Right carotid system: Stenosis. No brachiocephalic or right CCA The right ICA is occluded origin at the distal bulb (series 6, image 94 and series 10, image 87) and remains occluded to the skull base.  Left carotid system: Left CCA and proximal left ICA atherosclerosis without significant stenosis. Vertebral arteries: No proximal right subclavian artery stenosis. Mild calcified plaque at the right vertebral artery origin resulting in mild stenosis. Dominant right vertebral artery is patent to the skull base without additional stenosis. No significant proximal left subclavian artery stenosis despite soft and calcified plaque. Soft and calcified plaque near the origin of the non dominant left vertebral artery. Mild associated stenosis. The small left vertebral artery remains patent to the skull base without additional stenosis. CTA HEAD Posterior circulation: The dominant right vertebral artery supplies the basilar. The non dominant left vertebral appears to functionally terminates in PICA. The right PICA origin is patent. No distal right vertebral artery or basilar artery stenosis. Patent basilar tip. Fetal type right PCA origin, the right posterior communicating artery appears partially thrombosed as seen on series 13, image 15. Despite this, the right PCA remains patent. The distal right PCA branches arm only mildly hypoenhancing compared to the left. Normal left PCA origin. Left PCA branches are within normal limits. Anterior circulation: Patent left ICA siphon with no stenosis. Patent left ICA terminus. Normal left MCA and ACA origins. Left MCA M1 segment, bifurcation, and left MCA branches are within normal limits. The right ICA siphon is occluded, including the anterior portion of the right posterior communicating artery which appears to constitute a fetal type PCA origin. The ACAs and anterior communicating arteries are patent, and probably supplying collateral flow to the right MCA origin. The bilateral ACA branches are within normal limits. The right MCA M1 is patent, but the right MCA bifurcation is partially occluded. Paucity of right MCA M2 and distal branch enhancement (series 13, image 10). Venous  sinuses: Patent on the delayed images. Anatomic variants: Dominant right vertebral artery and fetal type right PCA origin. Delayed phase: The abnormal right greater than left MCA territory hypodensity appears stable since 1029 hours. No definite acute cytotoxic edema. No abnormal enhancement identified. Review of the MIP images confirms the above findings IMPRESSION: 1. Positive for large vessel occlusion of the right ICA, and also the right MCA bifurcation. - furthermore, there is partial occlusion of the right Pcomm which constitutes a fetal type right PCA origin. The right PCA remains patent. - there is collateral flow to the right M1 via the anterior communicating artery and ACAs. 2. Study discussed by telephone with Dr. Milon Dikes on 10/16/2017 at  1055 hours, and again at 11:17. The CT perfusion analysis is likely unreliable due to motion resulting in omission of some of the arterial input phase. We discussed that in light of the above CTA findings, I suspect the 24 mL core infarct volume in the right hemisphere may be fairly accurate, with the right occipital lobe involvement explained by the fetal right PCA findings above. Probably then, much of the remaining right hemisphere is at risk, aside from that affected by what I suspect is a chronic right operculum infarct. Dr. Wilford Corner will attempt a stat DWI only MRI scan for confirmation, with NIR intervention pending. 3. No superimposed hemodynamically significant stenosis in the left carotid or vertebrobasilar system. The right vertebral is dominant and supplies the basilar. Electronically Signed   By: Odessa Fleming M.D.   On: 10-15-2017 11:22   Ct Angio Neck W Or Wo Contrast  Result Date: 15-Oct-2017 CLINICAL DATA:  Code stroke. 84 year old female found unresponsive with gaze deviation. Family denies knowledge of a previous stroke however, there is bilateral MCA territory ischemia which most resembles chronic encephalomalacia on the plain CT at 1028 hours today.  EXAM: CT ANGIOGRAPHY HEAD AND NECK CT PERFUSION BRAIN TECHNIQUE: Multidetector CT imaging of the head and neck was performed using the standard protocol during bolus administration of intravenous contrast. Multiplanar CT image reconstructions and MIPs were obtained to evaluate the vascular anatomy. Carotid stenosis measurements (when applicable) are obtained utilizing NASCET criteria, using the distal internal carotid diameter as the denominator. Multiphase CT imaging of the brain was performed following IV bolus contrast injection. Subsequent parametric perfusion maps were calculated using RAPID software. CONTRAST:  90mL ISOVUE-370 IOPAMIDOL (ISOVUE-370) INJECTION 76% COMPARISON:  Head CT without contrast 1028 hours today. FINDINGS: CT Brain Perfusion Findings: The CTP is significantly degraded by motion artifact despite. This resulted in automatic exclusion of multiple segments from the computer analysis, including a significant portion of the arterial input function (series 1401, image 9). CBF (<30%) Volume: 24 milliliters, predominantly in the posterior right hemisphere, but this could be unreliable due to motion. Perfusion (Tmax>6.0s) volume: Bilateral cerebellar and cerebral hemisphere abnormal T-max values up to 200 milliliters, some of this is likely artifactual. There is right greater than left MCA territory abnormal T-max. Infarction Location:Posterior right hemisphere. CTA NECK Skeleton: Advanced cervical spine degeneration. No acute osseous abnormality identified. Upper chest: Mild motion artifact. Negative upper lungs. No superior mediastinal lymphadenopathy. Other neck: Negative; no neck mass or lymphadenopathy. Aortic arch: 3 vessel arch configuration with mild to moderate soft and calcified arch atherosclerosis. Right carotid system: Stenosis. No brachiocephalic or right CCA The right ICA is occluded origin at the distal bulb (series 6, image 94 and series 10, image 87) and remains occluded to the  skull base. Left carotid system: Left CCA and proximal left ICA atherosclerosis without significant stenosis. Vertebral arteries: No proximal right subclavian artery stenosis. Mild calcified plaque at the right vertebral artery origin resulting in mild stenosis. Dominant right vertebral artery is patent to the skull base without additional stenosis. No significant proximal left subclavian artery stenosis despite soft and calcified plaque. Soft and calcified plaque near the origin of the non dominant left vertebral artery. Mild associated stenosis. The small left vertebral artery remains patent to the skull base without additional stenosis. CTA HEAD Posterior circulation: The dominant right vertebral artery supplies the basilar. The non dominant left vertebral appears to functionally terminates in PICA. The right PICA origin is patent. No distal right vertebral artery or basilar artery  stenosis. Patent basilar tip. Fetal type right PCA origin, the right posterior communicating artery appears partially thrombosed as seen on series 13, image 15. Despite this, the right PCA remains patent. The distal right PCA branches arm only mildly hypoenhancing compared to the left. Normal left PCA origin. Left PCA branches are within normal limits. Anterior circulation: Patent left ICA siphon with no stenosis. Patent left ICA terminus. Normal left MCA and ACA origins. Left MCA M1 segment, bifurcation, and left MCA branches are within normal limits. The right ICA siphon is occluded, including the anterior portion of the right posterior communicating artery which appears to constitute a fetal type PCA origin. The ACAs and anterior communicating arteries are patent, and probably supplying collateral flow to the right MCA origin. The bilateral ACA branches are within normal limits. The right MCA M1 is patent, but the right MCA bifurcation is partially occluded. Paucity of right MCA M2 and distal branch enhancement (series 13, image  10). Venous sinuses: Patent on the delayed images. Anatomic variants: Dominant right vertebral artery and fetal type right PCA origin. Delayed phase: The abnormal right greater than left MCA territory hypodensity appears stable since 1029 hours. No definite acute cytotoxic edema. No abnormal enhancement identified. Review of the MIP images confirms the above findings IMPRESSION: 1. Positive for large vessel occlusion of the right ICA, and also the right MCA bifurcation. - furthermore, there is partial occlusion of the right Pcomm which constitutes a fetal type right PCA origin. The right PCA remains patent. - there is collateral flow to the right M1 via the anterior communicating artery and ACAs. 2. Study discussed by telephone with Dr. Milon Dikes on 10/12/2017 at 1055 hours, and again at 11:17. The CT perfusion analysis is likely unreliable due to motion resulting in omission of some of the arterial input phase. We discussed that in light of the above CTA findings, I suspect the 24 mL core infarct volume in the right hemisphere may be fairly accurate, with the right occipital lobe involvement explained by the fetal right PCA findings above. Probably then, much of the remaining right hemisphere is at risk, aside from that affected by what I suspect is a chronic right operculum infarct. Dr. Wilford Corner will attempt a stat DWI only MRI scan for confirmation, with NIR intervention pending. 3. No superimposed hemodynamically significant stenosis in the left carotid or vertebrobasilar system. The right vertebral is dominant and supplies the basilar. Electronically Signed   By: Odessa Fleming M.D.   On: 10/14/2017 11:22   Mr Brain Wo Contrast  Result Date: 10/17/2017 CLINICAL DATA:  84 year old female is poorly responsive with right ICA, right MCA bifurcation, and right Pcomm/PCA origin large vessel occlusion on CTA. CT perfusion affected by motion artifact, and confounding chronic appearing right greater than left MCA territory  infarcts on presentation noncontrast head CT. EXAM: MRI HEAD WITHOUT CONTRAST LIMITED TECHNIQUE: Diffusion and FLAIR pulse sequences of the brain and surrounding structures were obtained without intravenous contrast. COMPARISON:  Head CT, CTA and CT perfusion earlier today. FINDINGS: Axial diffusion weighted imaging demonstrates confluent abnormal signal in the posterior right hemisphere corresponding to the right MCA and right MCA/PCA watershed territory. On ADC much of this appears restricted on diffusion. Superimposed chronic encephalomalacia at the right frontal operculum. Superimposed cortical and subcortical white matter restricted diffusion in the left PCA territory and left PCA/MCA watershed. There is a small area of cortical encephalomalacia at the left posterior sylvian fissure although there appears to be some curvilinear surrounding cortical  restricted diffusion or susceptibility artifact. Mildly motion degraded axial FLAIR imaging demonstrates the chronic areas of encephalomalacia with little if any FLAIR hyperintensity corresponding to the restricted diffusion. There is no intracranial mass effect. No ventriculomegaly. Basilar cisterns remain patent. IMPRESSION: 1. Right MCA and MCA/PCA watershed territory restricted diffusion suggesting core infarct beyond that estimated on the CTP earlier today. This ischemia appears concordant with the CTA findings of right ICA, right MCA bifurcation and right Pcomm origin occlusion. 2. Additional contralateral Left PCA and MCA PCA watershed territory restricted diffusion. On review of the earlier CTA, there is a paucity of flow in the distal left P3 branches concordant with this finding, although the left P1, P2 segments and left PCA bifurcation appear normal. 3. This study discussed by telephone with Dr. Wilford CornerArora on beginning at 1127 hours. Electronically Signed   By: Odessa FlemingH  Hall M.D.   On: 07-Jul-2017 11:51   Ct Cerebral Perfusion W Contrast  Result Date:  Oct 31, 2017 CLINICAL DATA:  Code stroke. 84 year old female found unresponsive with gaze deviation. Family denies knowledge of a previous stroke however, there is bilateral MCA territory ischemia which most resembles chronic encephalomalacia on the plain CT at 1028 hours today. EXAM: CT ANGIOGRAPHY HEAD AND NECK CT PERFUSION BRAIN TECHNIQUE: Multidetector CT imaging of the head and neck was performed using the standard protocol during bolus administration of intravenous contrast. Multiplanar CT image reconstructions and MIPs were obtained to evaluate the vascular anatomy. Carotid stenosis measurements (when applicable) are obtained utilizing NASCET criteria, using the distal internal carotid diameter as the denominator. Multiphase CT imaging of the brain was performed following IV bolus contrast injection. Subsequent parametric perfusion maps were calculated using RAPID software. CONTRAST:  90mL ISOVUE-370 IOPAMIDOL (ISOVUE-370) INJECTION 76% COMPARISON:  Head CT without contrast 1028 hours today. FINDINGS: CT Brain Perfusion Findings: The CTP is significantly degraded by motion artifact despite. This resulted in automatic exclusion of multiple segments from the computer analysis, including a significant portion of the arterial input function (series 1401, image 9). CBF (<30%) Volume: 24 milliliters, predominantly in the posterior right hemisphere, but this could be unreliable due to motion. Perfusion (Tmax>6.0s) volume: Bilateral cerebellar and cerebral hemisphere abnormal T-max values up to 200 milliliters, some of this is likely artifactual. There is right greater than left MCA territory abnormal T-max. Infarction Location:Posterior right hemisphere. CTA NECK Skeleton: Advanced cervical spine degeneration. No acute osseous abnormality identified. Upper chest: Mild motion artifact. Negative upper lungs. No superior mediastinal lymphadenopathy. Other neck: Negative; no neck mass or lymphadenopathy. Aortic arch: 3  vessel arch configuration with mild to moderate soft and calcified arch atherosclerosis. Right carotid system: Stenosis. No brachiocephalic or right CCA The right ICA is occluded origin at the distal bulb (series 6, image 94 and series 10, image 87) and remains occluded to the skull base. Left carotid system: Left CCA and proximal left ICA atherosclerosis without significant stenosis. Vertebral arteries: No proximal right subclavian artery stenosis. Mild calcified plaque at the right vertebral artery origin resulting in mild stenosis. Dominant right vertebral artery is patent to the skull base without additional stenosis. No significant proximal left subclavian artery stenosis despite soft and calcified plaque. Soft and calcified plaque near the origin of the non dominant left vertebral artery. Mild associated stenosis. The small left vertebral artery remains patent to the skull base without additional stenosis. CTA HEAD Posterior circulation: The dominant right vertebral artery supplies the basilar. The non dominant left vertebral appears to functionally terminates in PICA. The right PICA origin is  patent. No distal right vertebral artery or basilar artery stenosis. Patent basilar tip. Fetal type right PCA origin, the right posterior communicating artery appears partially thrombosed as seen on series 13, image 15. Despite this, the right PCA remains patent. The distal right PCA branches arm only mildly hypoenhancing compared to the left. Normal left PCA origin. Left PCA branches are within normal limits. Anterior circulation: Patent left ICA siphon with no stenosis. Patent left ICA terminus. Normal left MCA and ACA origins. Left MCA M1 segment, bifurcation, and left MCA branches are within normal limits. The right ICA siphon is occluded, including the anterior portion of the right posterior communicating artery which appears to constitute a fetal type PCA origin. The ACAs and anterior communicating arteries are  patent, and probably supplying collateral flow to the right MCA origin. The bilateral ACA branches are within normal limits. The right MCA M1 is patent, but the right MCA bifurcation is partially occluded. Paucity of right MCA M2 and distal branch enhancement (series 13, image 10). Venous sinuses: Patent on the delayed images. Anatomic variants: Dominant right vertebral artery and fetal type right PCA origin. Delayed phase: The abnormal right greater than left MCA territory hypodensity appears stable since 1029 hours. No definite acute cytotoxic edema. No abnormal enhancement identified. Review of the MIP images confirms the above findings IMPRESSION: 1. Positive for large vessel occlusion of the right ICA, and also the right MCA bifurcation. - furthermore, there is partial occlusion of the right Pcomm which constitutes a fetal type right PCA origin. The right PCA remains patent. - there is collateral flow to the right M1 via the anterior communicating artery and ACAs. 2. Study discussed by telephone with Dr. Milon Dikes on October 21, 2017 at 1055 hours, and again at 11:17. The CT perfusion analysis is likely unreliable due to motion resulting in omission of some of the arterial input phase. We discussed that in light of the above CTA findings, I suspect the 24 mL core infarct volume in the right hemisphere may be fairly accurate, with the right occipital lobe involvement explained by the fetal right PCA findings above. Probably then, much of the remaining right hemisphere is at risk, aside from that affected by what I suspect is a chronic right operculum infarct. Dr. Wilford Corner will attempt a stat DWI only MRI scan for confirmation, with NIR intervention pending. 3. No superimposed hemodynamically significant stenosis in the left carotid or vertebrobasilar system. The right vertebral is dominant and supplies the basilar. Electronically Signed   By: Odessa Fleming M.D.   On: 2017/10/21 11:22   Dg Chest Port 1 View  Result Date:  2017-10-21 CLINICAL DATA:  Intubation EXAM: PORTABLE CHEST 1 VIEW COMPARISON:  None. FINDINGS: Endotracheal tube is 6 cm above the carina. NG tube is in the stomach. Heart is borderline in size. Bilateral perihilar and lower lobe opacities could reflect early edema or infection. No effusions or pneumothorax. Degenerative changes in the shoulders and thoracic spine. Aortic atherosclerosis. IMPRESSION: Borderline heart size. Perihilar and lower lobe airspace opacities bilaterally could reflect edema or infection. Endotracheal tube 6 cm above the carina. Electronically Signed   By: Charlett Nose M.D.   On: October 21, 2017 12:12   Ct Head Code Stroke Wo Contrast  Result Date: 2017/10/21 CLINICAL DATA:  Code stroke. 84 year old female found unresponsive with gaze deviation. Family denies knowledge of a previous stroke. EXAM: CT HEAD WITHOUT CONTRAST TECHNIQUE: Contiguous axial images were obtained from the base of the skull through the vertex without intravenous contrast.  COMPARISON:  No prior head CT. FINDINGS: Brain: There is a 3-4 centimeter area of hypodensity in the right MCA territory centered at the insula and operculum which has a chronic appearance. Some of this demonstrates dystrophic calcification. There is no associated mass effect. There is a smaller area of cortical hypodensity along the posterior left sylvian fissure which also has a chronic appearance (sagittal image 43). No associated mass effect. No definite acute cytotoxic edema identified. No intracranial hemorrhage or mass effect. No other cortical encephalomalacia identified. No ventriculomegaly. Patent basilar cisterns. Vascular: Calcified atherosclerosis at the skull base. No suspicious intracranial vascular hyperdensity. Skull: Intermittent motion artifact. No acute osseous abnormality identified. Sinuses/Orbits: Visualized paranasal sinuses and mastoids are well pneumatized. Other: Gaze appears to be midline. Postoperative changes to both globes.  No acute scalp soft tissue findings. ASPECTS [facility] Stroke Program Early CT Score) Total score (0-10 with 10 being normal): 10, allowing for bilateral MCA territory chronic appearing encephalomalacia. IMPRESSION: 1. Prior right greater than left MCA territory ischemia which is favored to be chronic. No intracranial hemorrhage, intracranial mass effect, or definite acute cytotoxic edema. 2. This was discussed by telephone with Dr. Wilford Corner on 10/14/17 at 1055 hours. Electronically Signed   By: Odessa Fleming M.D.   On: Oct 14, 2017 10:59     STUDIES:  Chest x-ray this morning to my eye shows a well-placed endotracheal tube with much better aeration of the right lower lobe.  CULTURES: Sputum Gram stain from yesterday shows many white cells and rare gram-positive cocci  ANTIBIOTICS: Unasyn initiated for aspiration on 7/15  SIGNIFICANT EVENTS:   LINES/TUBES:   DISCUSSION:     This is an 84 year old who is undergone thrombectomy for an occluded right ICA and right MCA occlusion.  ASSESSMENT / PLAN:  PULMONARY A: Chest x-ray following initial intubation suggested a right lower lobe infiltrate and she was empirically started on Unasyn for aspiration.  FiO2 requirements have dropped overnight and her chest x-ray has improved.  I am continuing the Unasyn for now but suspect that we are dealing with bland aspiration.  She is not a candidate for extubation due to her mental status.  CARDIOVASCULAR A: Maintaining systolic pressures between 120 and 140 at the request of interventional radiology following revascularization procedures   RENAL A: Correcting hypokalemia.  TSH to evaluate hyponatremia is pending   GASTROINTESTINAL A: Prophylaxis  is with Pepcid   HEMATOLOGIC A: DVT prophylaxis is with SCDs alone for now pending repeat CT scan of the head  INFECTIOUS A: On Unasyn for potential aspiration as noted however procalcitonin is less than 0.10   ENDOCRINE A: TSH is  pending   NEUROLOGIC A: This is day 1 status post presentation with left hemiplegia and thrombectomy of the right ICA and MCA.  Neurological exam was extremely poor after holding propofol this morning.  I have ordered a stat repeat head CT.  Greater than 32 minutes was spent in the care of this patient today who is demonstrating neurological instability.  Penny Pia, MD Critical Care Medicine  Pager: [phone]  10/06/2017, 7:15 AM

## 2017-10-06 NOTE — Progress Notes (Signed)
Pt's temp has gradually increased and is now 100.6 despite giving tylenol and ice packs.  Notified Dr. Roda ShuttersXu of this.  Cooling blanket ordered.  Also OK to relax SBP parameters to <160.  Will continue to monitor.

## 2017-10-07 DIAGNOSIS — J9601 Acute respiratory failure with hypoxia: Secondary | ICD-10-CM

## 2017-10-07 DIAGNOSIS — J9602 Acute respiratory failure with hypercapnia: Secondary | ICD-10-CM

## 2017-10-07 DIAGNOSIS — I639 Cerebral infarction, unspecified: Secondary | ICD-10-CM

## 2017-10-07 LAB — HEMOGLOBIN A1C
HEMOGLOBIN A1C: 5 % (ref 4.8–5.6)
Mean Plasma Glucose: 97 mg/dL

## 2017-10-07 LAB — CBC WITH DIFFERENTIAL/PLATELET
BASOS ABS: 0 10*3/uL (ref 0.0–0.1)
Basophils Relative: 0 %
Eosinophils Absolute: 0 10*3/uL (ref 0.0–0.7)
Eosinophils Relative: 0 %
HEMATOCRIT: 28.3 % — AB (ref 36.0–46.0)
HEMOGLOBIN: 9.2 g/dL — AB (ref 12.0–15.0)
Lymphocytes Relative: 8 %
Lymphs Abs: 1.2 10*3/uL (ref 0.7–4.0)
MCH: 22 pg — AB (ref 26.0–34.0)
MCHC: 32.5 g/dL (ref 30.0–36.0)
MCV: 67.5 fL — ABNORMAL LOW (ref 78.0–100.0)
MONOS PCT: 8 %
Monocytes Absolute: 1.2 10*3/uL — ABNORMAL HIGH (ref 0.1–1.0)
NEUTROS ABS: 12.8 10*3/uL — AB (ref 1.7–7.7)
Neutrophils Relative %: 84 %
Platelets: 140 10*3/uL — ABNORMAL LOW (ref 150–400)
RBC: 4.19 MIL/uL (ref 3.87–5.11)
RDW: 19.9 % — ABNORMAL HIGH (ref 11.5–15.5)
WBC: 15.2 10*3/uL — ABNORMAL HIGH (ref 4.0–10.5)

## 2017-10-07 LAB — BASIC METABOLIC PANEL
Anion gap: 7 (ref 5–15)
BUN: 8 mg/dL (ref 8–23)
CO2: 21 mmol/L — ABNORMAL LOW (ref 22–32)
Calcium: 7.7 mg/dL — ABNORMAL LOW (ref 8.9–10.3)
Chloride: 108 mmol/L (ref 98–111)
Creatinine, Ser: 0.52 mg/dL (ref 0.44–1.00)
GFR calc Af Amer: >60 mL/min (ref >60)
Glucose, Bld: 100 mg/dL — ABNORMAL HIGH (ref 70–99)
POTASSIUM: 3.2 mmol/L — AB (ref 3.5–5.1)
Sodium: 136 mmol/L (ref 135–145)

## 2017-10-07 LAB — BLOOD GAS, ARTERIAL
Acid-base deficit: 2.1 mmol/L — ABNORMAL HIGH (ref 0.0–2.0)
Bicarbonate: 21.9 mmol/L (ref 20.0–28.0)
Drawn by: 41977
FIO2: 40
LHR: 14 {breaths}/min
O2 Saturation: 97.9 %
PATIENT TEMPERATURE: 98.6
PCO2 ART: 35.5 mmHg (ref 32.0–48.0)
PEEP: 5 cmH2O
PH ART: 7.407 (ref 7.350–7.450)
VT: 440 mL
pO2, Arterial: 98.8 mmHg (ref 83.0–108.0)

## 2017-10-07 LAB — GLUCOSE, CAPILLARY
Glucose-Capillary: 110 mg/dL — ABNORMAL HIGH (ref 70–99)
Glucose-Capillary: 72 mg/dL (ref 70–99)
Glucose-Capillary: 98 mg/dL (ref 70–99)

## 2017-10-07 LAB — PROCALCITONIN: PROCALCITONIN: 0.24 ng/mL

## 2017-10-07 MED ORDER — GLYCOPYRROLATE 0.2 MG/ML IJ SOLN: 0.2000 mg | INTRAMUSCULAR | Status: DC | PRN

## 2017-10-07 MED ORDER — MORPHINE 100MG IN NS 100ML (1MG/ML) PREMIX INFUSION
4.0000 mg/h | INTRAVENOUS | Status: DC
  Administered 2017-10-07 – 2017-10-08 (×2): 4 mg/h via INTRAVENOUS
  Filled 2017-10-07 (×3): qty 100

## 2017-10-07 MED ORDER — POTASSIUM CHLORIDE 10 MEQ/100ML IV SOLN
10.0000 meq | INTRAVENOUS | Status: DC
  Administered 2017-10-07: 10 meq via INTRAVENOUS
  Filled 2017-10-07 (×3): qty 100

## 2017-10-07 MED ORDER — LORAZEPAM 2 MG/ML IJ SOLN
1.0000 mg | Freq: Four times a day (QID) | INTRAMUSCULAR | Status: DC
  Administered 2017-10-07 – 2017-10-08 (×5): 1 mg via INTRAVENOUS
  Filled 2017-10-07 (×5): qty 1

## 2017-10-07 NOTE — Progress Notes (Addendum)
PULMONARY / CRITICAL CARE MEDICINE   Name: Kathryn Johnson MRN: [id number] DOB: 11/01/1930    ADMISSION DATE:  10/11/2017 CONSULTATION DATE:  10/02/2017  REFERRING MD:  Wilford Corner  CHIEF COMPLAINT:  Left hemiplegia  HISTORY OF PRESENT ILLNESS:     84 year old female with PMH significant for HTN and anxiety, who presented for altered mental status and left sided weakness.   Initially thought LSW was today at 0915 when patient awoke, however family later clarified that she woke up anxious and went to bed normal around 2200.  Family found patient sitting on toilet staring forward and noted to have defecated on herself.  She presented as a code stroke.  CTA head and neck showed right ICA occlusion age-indeterminate and right MCA occlusion. CT perfusion was motion degraded, unable to   Taken for MRI brain which showed a large right hemispherical stroke in the right MCA and right PCA territory along with a left MCA/ PCA watershed and left PCA stroke.  Patient taken for diagnostic cerebral angiogram under general anesthesia where revascularization was completed to the right ICA and right PCA.    intermittent tachycardia last night for which metoprolol was added. Not at all interactive for me this morning when propofol is held. Still intubated and mechanically ventilated      PAST MEDICAL HISTORY :  She  has no past medical history on file.  PAST SURGICAL HISTORY: She  has a past surgical history that includes Breast excisional biopsy (Right); Breast lumpectomy (Right); and Radiology with anesthesia (N/A, 09/25/2017).  No Known Allergies  No current facility-administered medications on file prior to encounter.    Current Outpatient Medications on File Prior to Encounter  Medication Sig  . amLODipine (NORVASC) 5 MG tablet Take 7.5 mg by mouth daily.  Marland Kitchen aspirin EC 81 MG tablet Take 81 mg by mouth daily.  . AZO-CRANBERRY PO Take 1 tablet by mouth daily.  . calcium carbonate (OSCAL) 1500 (600 Ca) MG  TABS tablet Take 1,500 mg by mouth 2 (two) times daily with a meal.  . Cholecalciferol (VITAMIN D PO) Take 1 tablet by mouth daily.  . enalapril (VASOTEC) 20 MG tablet Take 20 mg by mouth 2 (two) times daily.  . folic acid (FOLVITE) 400 MCG tablet Take 400 mcg by mouth daily.  . hydrochlorothiazide (HYDRODIURIL) 25 MG tablet Take 25 mg by mouth every morning.  Marland Kitchen LORazepam (ATIVAN) 0.5 MG tablet Take 0.25 mg by mouth every 8 (eight) hours as needed for anxiety.  . metoprolol succinate (TOPROL-XL) 100 MG 24 hr tablet Take 100 mg by mouth daily.  . metoprolol succinate (TOPROL-XL) 25 MG 24 hr tablet Take 25 mg by mouth daily.  . Multiple Vitamins-Minerals (PROTEGRA PO) Take 1-2 capsules by mouth daily.  . Probiotic Product (PROBIOTIC PO) Take 1 capsule by mouth daily.  . psyllium (HYDROCIL/METAMUCIL) 95 % PACK Take 1 packet by mouth daily.  Marland Kitchen pyridOXINE (VITAMIN B-6) 50 MG tablet Take 50 mg by mouth daily.    FAMILY HISTORY:  Her family history includes Aneurysm in her mother; Heart disease in her brother.  SOCIAL HISTORY: She    REVIEW OF SYSTEMS:   Unobtainable  SUBJECTIVE:  As above  VITAL SIGNS: BP (!) 173/64   Pulse 84   Temp (!) 100.4 F (38 C)   Resp (!) 24   Ht 5\' 4"  (1.626 m)   Wt 165 lb 5.5 oz (75 kg)   SpO2 100%   BMI 28.38 kg/m   HEMODYNAMICS:  VENTILATOR SETTINGS: Vent Mode: PSV;CPAP FiO2 (%):  [40 %] 40 % Set Rate:  [14 bmp] 14 bmp Vt Set:  [440 mL] 440 mL PEEP:  [5 cmH20] 5 cmH20 Pressure Support:  [5 cmH20-10 cmH20] 10 cmH20 Plateau Pressure:  [10 cmH20-19 cmH20] 17 cmH20  INTAKE / OUTPUT: I/O last 3 completed shifts: In: 4351.7 [I.V.:3296.9; IV Piggyback:1054.8] Out: 2743 [Urine:2743]  PHYSICAL EXAMINATION: General: Elderly female who is orally intubated and mechanically ventilated.   Neuro: No response to loud noise or voice.  In response to sternal rub there is minimal movement of the right upper extremity.  There is no eye opening.  Pupils  are equal, EOMs appear to be full.   Cardiovascular: S1 and S2 are currently regular without murmur rub or gallop.  There is no JVD, there is no dependent edema.   Lungs: Respirations are unlabored on CPAP with 10 of pressure support.  There is symmetric air movement, no wheezes.  There are very few scattered rhonchi.   Abdomen: The abdomen is soft without any overt organomegaly masses tenderness guarding or rebound  Musculoskeletal: Limbs are pink and warm   LABS:  BMET Recent Labs  Lab 10-15-2017 1711 10/06/17 0315 10/07/17 0405  NA 131* 134* 136  K 2.4* 3.1* 3.2*  CL 99 103 108  CO2 23 23 21*  BUN 11 10 8   CREATININE 0.53 0.57 0.52  GLUCOSE 113* 101* 100*    Electrolytes Recent Labs  Lab 10-15-2017 1711 10/06/17 0315 10/07/17 0405  CALCIUM 7.0* 7.4* 7.7*  MG 1.3*  --   --   PHOS 2.8 2.3*  --     CBC Recent Labs  Lab 2017-10-15 1011 10-15-2017 1017 10/06/17 0315 10/07/17 0405  WBC 14.5*  --  13.4* 15.2*  HGB 12.9 15.3* 9.3* 9.2*  HCT 39.2 45.0 29.2* 28.3*  PLT 365  --  148* 140*    Coag's Recent Labs  Lab 10-15-17 1011  APTT 31  INR 1.04    Sepsis Markers Recent Labs  Lab Oct 15, 2017 1711 10/06/17 0315 10/07/17 0405  PROCALCITON <0.10 <0.10 0.24    ABG Recent Labs  Lab 10-15-17 1724 10/07/17 0355  PHART 7.377 7.407  PCO2ART 37.9 35.5  PO2ART 91.0 98.8    Liver Enzymes Recent Labs  Lab 10/15/2017 1011 10/15/17 1711 10/06/17 0315  AST 23  --   --   ALT 19  --   --   ALKPHOS 62  --   --   BILITOT 1.7*  --   --   ALBUMIN 4.2 2.8* 2.8*    Cardiac Enzymes No results for input(s): TROPONINI, PROBNP in the last 168 hours.  Glucose Recent Labs  Lab 10/06/17 1145 10/06/17 1610 10/06/17 1940 10/06/17 2320 10/07/17 0323 10/07/17 0814  GLUCAP 108* 104* 97 108* 110* 72    Imaging Ct Head Wo Contrast  Result Date: 10/06/2017 CLINICAL DATA:  Altered level of consciousness EXAM: CT HEAD WITHOUT CONTRAST TECHNIQUE: Contiguous axial images  were obtained from the base of the skull through the vertex without intravenous contrast. COMPARISON:  10/15/2017 FINDINGS: Brain: Motion artifact degrades image quality. There is new diffuse low-density noted throughout much of the right cerebral hemisphere, most notable in the temporal, parietal and occipital lobes. New low-density also noted posteriorly in the left occipital lobe and posterior parietal lobe. Findings are compatible with acute infarctions. Old infarcts noted in the right frontotemporal lobe with associated parenchymal calcifications and in the left posterior frontal lobe. No hydrocephalus or hemorrhage. Vascular:  No evidence of aneurysm or adenopathy. Skull: No acute calvarial abnormality. Sinuses/Orbits: Visualized paranasal sinuses and mastoids clear. Orbital soft tissues unremarkable. Other: None IMPRESSION: New large acute infarcts involving much of the mid and posterior right cerebral hemisphere as well as the left occipital lobe. 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 No hemorrhage. Electronically Signed   By: Charlett NoseKevin  Dover M.D.   On: 10/06/2017 08:32     STUDIES:    CULTURES: Sputum Gram stain from 7/15 shows many white cells and rare gram-positive cocci  ANTIBIOTICS: Unasyn initiated for aspiration on 7/15  SIGNIFICANT EVENTS:   LINES/TUBES:   DISCUSSION:     This is an 84 year old who is undergone thrombectomy for an occluded right ICA and right MCA occlusion.  ASSESSMENT / PLAN:  PULMONARY A: Chest x-ray following initial intubation suggested a right lower lobe infiltrate and she was empirically started on Unasyn for aspiration.  Oxygen requirements have significantly dropped since admission and her chest x-ray has improved.  I am continuing the Unasyn for now but suspect that we are dealing with bland aspiration.  She is tolerating CPAP with low pressure support this morning however she is not a candidate for extubation based on her mental status    CARDIOVASCULAR A: Maintaining systolic pressures between 120 and 140 at the request of interventional radiology following revascularization procedures   RENAL A: Continue to correct hypokalemia   GASTROINTESTINAL A: Prophylaxis  is with Pepcid   HEMATOLOGIC A: DVT prophylaxis is with SCDs and subcu heparin  INFECTIOUS A: On Unasyn for potential aspiration as noted however procalcitonin is less than 0.10   ENDOCRINE A: TSH is normal    NEUROLOGIC A: This is day 2 status post presentation with a left hemiplegia and subsequent thrombectomy of the right ICA and MCA.  She continues to have a dense left hemiplegia and a CT scan of the head yesterday showed extensive infarction of the right MCA territory.  I have shared that scan with family today and discussed with them that it would appear unlikely that she will recover function on the left side.  I have asked him to discuss with neurology what they feel her potential for recovery might be, family is aware of her wishes that if she would to become a person totally dependent on others for self-care, she would not wish to be supported.  I am awaiting their decision following discussion with neurology today as to the level of care they wish to pursue.  Greater than 32 minutes was spent in the care of this patient today who is demonstrating neurological instability.  Penny PiaWJ Rolanda Campa, MD Critical Care Medicine  Pager: [phone]  10/07/2017, 8:21 AM   Addendum 14:07  Family has opted for comfort measures only after discussions with myself and Dr. Roda ShuttersXu.

## 2017-10-07 NOTE — Progress Notes (Signed)
STROKE TEAM PROGRESS NOTE   SUBJECTIVE (INTERVAL HISTORY) Her daughter and son in law are at the bedside.  Pt still intubated on vent, off propofol. Not open eyes with voice or pain, right UE movement less than yesterday. MRI and MRA pending but discussed with family and they declined further study and would like to initiate comfort care measure given poor prognosis.     OBJECTIVE Temp:  [97.2 F (36.2 C)-102.2 F (39 C)] 97.8 F (36.6 C) (07/17 1400) Pulse Rate:  [28-98] 73 (07/17 1400) Cardiac Rhythm: Normal sinus rhythm;Sinus tachycardia (07/17 0800) Resp:  [14-28] 15 (07/17 1400) BP: (96-201)/(50-131) 176/74 (07/17 1200) SpO2:  [94 %-100 %] 96 % (07/17 1400) Arterial Line BP: (105-170)/(48-95) 155/72 (07/17 1130) FiO2 (%):  [40 %] 40 % (07/17 1130)  Recent Labs  Lab 10/06/17 1940 10/06/17 2320 10/07/17 0323 10/07/17 0814 10/07/17 1152  GLUCAP 97 108* 110* 72 98   Recent Labs  Lab 20-Oct-2017 1011 10/20/17 1017 10-20-17 1711 10/06/17 0315 10/07/17 0405  NA 129* 128* 131* 134* 136  K 3.0* 3.0* 2.4* 3.1* 3.2*  CL 91* 91* 99 103 108  CO2 25  --  23 23 21*  GLUCOSE 125* 125* 113* 101* 100*  BUN 15 16 11 10 8   CREATININE 0.64 0.50 0.53 0.57 0.52  CALCIUM 9.5  --  7.0* 7.4* 7.7*  MG  --   --  1.3*  --   --   PHOS  --   --  2.8 2.3*  --    Recent Labs  Lab 10/20/2017 1011 10-20-2017 1711 10/06/17 0315  AST 23  --   --   ALT 19  --   --   ALKPHOS 62  --   --   BILITOT 1.7*  --   --   PROT 7.6  --   --   ALBUMIN 4.2 2.8* 2.8*   Recent Labs  Lab 10/20/2017 1011 10-20-17 1017 10/06/17 0315 10/07/17 0405  WBC 14.5*  --  13.4* 15.2*  NEUTROABS 12.2*  --  11.2* 12.8*  HGB 12.9 15.3* 9.3* 9.2*  HCT 39.2 45.0 29.2* 28.3*  MCV 67.4*  --  67.0* 67.5*  PLT 365  --  148* 140*   No results for input(s): CKTOTAL, CKMB, CKMBINDEX, TROPONINI in the last 168 hours. Recent Labs    20-Oct-2017 1011  LABPROT 13.5  INR 1.04   No results for input(s): COLORURINE, LABSPEC,  PHURINE, GLUCOSEU, HGBUR, BILIRUBINUR, KETONESUR, PROTEINUR, UROBILINOGEN, NITRITE, LEUKOCYTESUR in the last 72 hours.  Invalid input(s): APPERANCEUR     Component Value Date/Time   CHOL 142 10/06/2017 0315   TRIG 149 10/06/2017 0315   HDL 27 (L) 10/06/2017 0315   CHOLHDL 5.3 10/06/2017 0315   VLDL 30 10/06/2017 0315   LDLCALC 85 10/06/2017 0315   Lab Results  Component Value Date   HGBA1C 5.0 10/06/2017   No results found for: LABOPIA, COCAINSCRNUR, LABBENZ, AMPHETMU, THCU, LABBARB  Recent Labs  Lab Oct 20, 2017 1011  ETH <10    I have personally reviewed the radiological images below and agree with the radiology interpretations.  Ct Angio Head W Or Wo Contrast  Result Date: 10/20/2017 CLINICAL DATA:  Code stroke. 84 year old female found unresponsive with gaze deviation. Family denies knowledge of a previous stroke however, there is bilateral MCA territory ischemia which most resembles chronic encephalomalacia on the plain CT at 1028 hours today. EXAM: CT ANGIOGRAPHY HEAD AND NECK CT PERFUSION BRAIN TECHNIQUE: Multidetector CT imaging of the head and  neck was performed using the standard protocol during bolus administration of intravenous contrast. Multiplanar CT image reconstructions and MIPs were obtained to evaluate the vascular anatomy. Carotid stenosis measurements (when applicable) are obtained utilizing NASCET criteria, using the distal internal carotid diameter as the denominator. Multiphase CT imaging of the brain was performed following IV bolus contrast injection. Subsequent parametric perfusion maps were calculated using RAPID software. CONTRAST:  90mL ISOVUE-370 IOPAMIDOL (ISOVUE-370) INJECTION 76% COMPARISON:  Head CT without contrast 1028 hours today. FINDINGS: CT Brain Perfusion Findings: The CTP is significantly degraded by motion artifact despite. This resulted in automatic exclusion of multiple segments from the computer analysis, including a significant portion of the  arterial input function (series 1401, image 9). CBF (<30%) Volume: 24 milliliters, predominantly in the posterior right hemisphere, but this could be unreliable due to motion. Perfusion (Tmax>6.0s) volume: Bilateral cerebellar and cerebral hemisphere abnormal T-max values up to 200 milliliters, some of this is likely artifactual. There is right greater than left MCA territory abnormal T-max. Infarction Location:Posterior right hemisphere. CTA NECK Skeleton: Advanced cervical spine degeneration. No acute osseous abnormality identified. Upper chest: Mild motion artifact. Negative upper lungs. No superior mediastinal lymphadenopathy. Other neck: Negative; no neck mass or lymphadenopathy. Aortic arch: 3 vessel arch configuration with mild to moderate soft and calcified arch atherosclerosis. Right carotid system: Stenosis. No brachiocephalic or right CCA The right ICA is occluded origin at the distal bulb (series 6, image 94 and series 10, image 87) and remains occluded to the skull base. Left carotid system: Left CCA and proximal left ICA atherosclerosis without significant stenosis. Vertebral arteries: No proximal right subclavian artery stenosis. Mild calcified plaque at the right vertebral artery origin resulting in mild stenosis. Dominant right vertebral artery is patent to the skull base without additional stenosis. No significant proximal left subclavian artery stenosis despite soft and calcified plaque. Soft and calcified plaque near the origin of the non dominant left vertebral artery. Mild associated stenosis. The small left vertebral artery remains patent to the skull base without additional stenosis. CTA HEAD Posterior circulation: The dominant right vertebral artery supplies the basilar. The non dominant left vertebral appears to functionally terminates in PICA. The right PICA origin is patent. No distal right vertebral artery or basilar artery stenosis. Patent basilar tip. Fetal type right PCA origin, the  right posterior communicating artery appears partially thrombosed as seen on series 13, image 15. Despite this, the right PCA remains patent. The distal right PCA branches arm only mildly hypoenhancing compared to the left. Normal left PCA origin. Left PCA branches are within normal limits. Anterior circulation: Patent left ICA siphon with no stenosis. Patent left ICA terminus. Normal left MCA and ACA origins. Left MCA M1 segment, bifurcation, and left MCA branches are within normal limits. The right ICA siphon is occluded, including the anterior portion of the right posterior communicating artery which appears to constitute a fetal type PCA origin. The ACAs and anterior communicating arteries are patent, and probably supplying collateral flow to the right MCA origin. The bilateral ACA branches are within normal limits. The right MCA M1 is patent, but the right MCA bifurcation is partially occluded. Paucity of right MCA M2 and distal branch enhancement (series 13, image 10). Venous sinuses: Patent on the delayed images. Anatomic variants: Dominant right vertebral artery and fetal type right PCA origin. Delayed phase: The abnormal right greater than left MCA territory hypodensity appears stable since 1029 hours. No definite acute cytotoxic edema. No abnormal enhancement identified. Review of the  MIP images confirms the above findings IMPRESSION: 1. Positive for large vessel occlusion of the right ICA, and also the right MCA bifurcation. - furthermore, there is partial occlusion of the right Pcomm which constitutes a fetal type right PCA origin. The right PCA remains patent. - there is collateral flow to the right M1 via the anterior communicating artery and ACAs. 2. Study discussed by telephone with Dr. Milon Dikes on 10/02/2017 at 1055 hours, and again at 11:17. The CT perfusion analysis is likely unreliable due to motion resulting in omission of some of the arterial input phase. We discussed that in light of the  above CTA findings, I suspect the 24 mL core infarct volume in the right hemisphere may be fairly accurate, with the right occipital lobe involvement explained by the fetal right PCA findings above. Probably then, much of the remaining right hemisphere is at risk, aside from that affected by what I suspect is a chronic right operculum infarct. Dr. Wilford Corner will attempt a stat DWI only MRI scan for confirmation, with NIR intervention pending. 3. No superimposed hemodynamically significant stenosis in the left carotid or vertebrobasilar system. The right vertebral is dominant and supplies the basilar. Electronically Signed   By: Odessa Fleming M.D.   On: 10/07/2017 11:22   Ct Head Wo Contrast  Result Date: 10/06/2017 CLINICAL DATA:  Altered level of consciousness EXAM: CT HEAD WITHOUT CONTRAST TECHNIQUE: Contiguous axial images were obtained from the base of the skull through the vertex without intravenous contrast. COMPARISON:  09/28/2017 FINDINGS: Brain: Motion artifact degrades image quality. There is new diffuse low-density noted throughout much of the right cerebral hemisphere, most notable in the temporal, parietal and occipital lobes. New low-density also noted posteriorly in the left occipital lobe and posterior parietal lobe. Findings are compatible with acute infarctions. Old infarcts noted in the right frontotemporal lobe with associated parenchymal calcifications and in the left posterior frontal lobe. No hydrocephalus or hemorrhage. Vascular: No evidence of aneurysm or adenopathy. Skull: No acute calvarial abnormality. Sinuses/Orbits: Visualized paranasal sinuses and mastoids clear. Orbital soft tissues unremarkable. Other: None IMPRESSION: New large acute infarcts involving much of the mid and posterior right cerebral hemisphere as well as the left occipital lobe. 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 No hemorrhage. Electronically Signed   By: Charlett Nose M.D.   On: 10/06/2017 08:32   Ct  Angio Neck W Or Wo Contrast  Result Date: 10/07/2017 CLINICAL DATA:  Code stroke. 84 year old female found unresponsive with gaze deviation. Family denies knowledge of a previous stroke however, there is bilateral MCA territory ischemia which most resembles chronic encephalomalacia on the plain CT at 1028 hours today. EXAM: CT ANGIOGRAPHY HEAD AND NECK CT PERFUSION BRAIN TECHNIQUE: Multidetector CT imaging of the head and neck was performed using the standard protocol during bolus administration of intravenous contrast. Multiplanar CT image reconstructions and MIPs were obtained to evaluate the vascular anatomy. Carotid stenosis measurements (when applicable) are obtained utilizing NASCET criteria, using the distal internal carotid diameter as the denominator. Multiphase CT imaging of the brain was performed following IV bolus contrast injection. Subsequent parametric perfusion maps were calculated using RAPID software. CONTRAST:  90mL ISOVUE-370 IOPAMIDOL (ISOVUE-370) INJECTION 76% COMPARISON:  Head CT without contrast 1028 hours today. FINDINGS: CT Brain Perfusion Findings: The CTP is significantly degraded by motion artifact despite. This resulted in automatic exclusion of multiple segments from the computer analysis, including a significant portion of the arterial input function (series 1401, image 9). CBF (<30%) Volume:  24 milliliters, predominantly in the posterior right hemisphere, but this could be unreliable due to motion. Perfusion (Tmax>6.0s) volume: Bilateral cerebellar and cerebral hemisphere abnormal T-max values up to 200 milliliters, some of this is likely artifactual. There is right greater than left MCA territory abnormal T-max. Infarction Location:Posterior right hemisphere. CTA NECK Skeleton: Advanced cervical spine degeneration. No acute osseous abnormality identified. Upper chest: Mild motion artifact. Negative upper lungs. No superior mediastinal lymphadenopathy. Other neck: Negative; no  neck mass or lymphadenopathy. Aortic arch: 3 vessel arch configuration with mild to moderate soft and calcified arch atherosclerosis. Right carotid system: Stenosis. No brachiocephalic or right CCA The right ICA is occluded origin at the distal bulb (series 6, image 94 and series 10, image 87) and remains occluded to the skull base. Left carotid system: Left CCA and proximal left ICA atherosclerosis without significant stenosis. Vertebral arteries: No proximal right subclavian artery stenosis. Mild calcified plaque at the right vertebral artery origin resulting in mild stenosis. Dominant right vertebral artery is patent to the skull base without additional stenosis. No significant proximal left subclavian artery stenosis despite soft and calcified plaque. Soft and calcified plaque near the origin of the non dominant left vertebral artery. Mild associated stenosis. The small left vertebral artery remains patent to the skull base without additional stenosis. CTA HEAD Posterior circulation: The dominant right vertebral artery supplies the basilar. The non dominant left vertebral appears to functionally terminates in PICA. The right PICA origin is patent. No distal right vertebral artery or basilar artery stenosis. Patent basilar tip. Fetal type right PCA origin, the right posterior communicating artery appears partially thrombosed as seen on series 13, image 15. Despite this, the right PCA remains patent. The distal right PCA branches arm only mildly hypoenhancing compared to the left. Normal left PCA origin. Left PCA branches are within normal limits. Anterior circulation: Patent left ICA siphon with no stenosis. Patent left ICA terminus. Normal left MCA and ACA origins. Left MCA M1 segment, bifurcation, and left MCA branches are within normal limits. The right ICA siphon is occluded, including the anterior portion of the right posterior communicating artery which appears to constitute a fetal type PCA origin. The  ACAs and anterior communicating arteries are patent, and probably supplying collateral flow to the right MCA origin. The bilateral ACA branches are within normal limits. The right MCA M1 is patent, but the right MCA bifurcation is partially occluded. Paucity of right MCA M2 and distal branch enhancement (series 13, image 10). Venous sinuses: Patent on the delayed images. Anatomic variants: Dominant right vertebral artery and fetal type right PCA origin. Delayed phase: The abnormal right greater than left MCA territory hypodensity appears stable since 1029 hours. No definite acute cytotoxic edema. No abnormal enhancement identified. Review of the MIP images confirms the above findings IMPRESSION: 1. Positive for large vessel occlusion of the right ICA, and also the right MCA bifurcation. - furthermore, there is partial occlusion of the right Pcomm which constitutes a fetal type right PCA origin. The right PCA remains patent. - there is collateral flow to the right M1 via the anterior communicating artery and ACAs. 2. Study discussed by telephone with Dr. Milon Dikes on 10/06/2017 at 1055 hours, and again at 11:17. The CT perfusion analysis is likely unreliable due to motion resulting in omission of some of the arterial input phase. We discussed that in light of the above CTA findings, I suspect the 24 mL core infarct volume in the right hemisphere may be fairly accurate,  with the right occipital lobe involvement explained by the fetal right PCA findings above. Probably then, much of the remaining right hemisphere is at risk, aside from that affected by what I suspect is a chronic right operculum infarct. Dr. Wilford CornerArora will attempt a stat DWI only MRI scan for confirmation, with NIR intervention pending. 3. No superimposed hemodynamically significant stenosis in the left carotid or vertebrobasilar system. The right vertebral is dominant and supplies the basilar. Electronically Signed   By: Odessa FlemingH  Hall M.D.   On: 2017/11/24  11:22   Mr Brain Wo Contrast  Result Date: 2017/07/07 CLINICAL DATA:  84 year old female is poorly responsive with right ICA, right MCA bifurcation, and right Pcomm/PCA origin large vessel occlusion on CTA. CT perfusion affected by motion artifact, and confounding chronic appearing right greater than left MCA territory infarcts on presentation noncontrast head CT. EXAM: MRI HEAD WITHOUT CONTRAST LIMITED TECHNIQUE: Diffusion and FLAIR pulse sequences of the brain and surrounding structures were obtained without intravenous contrast. COMPARISON:  Head CT, CTA and CT perfusion earlier today. FINDINGS: Axial diffusion weighted imaging demonstrates confluent abnormal signal in the posterior right hemisphere corresponding to the right MCA and right MCA/PCA watershed territory. On ADC much of this appears restricted on diffusion. Superimposed chronic encephalomalacia at the right frontal operculum. Superimposed cortical and subcortical white matter restricted diffusion in the left PCA territory and left PCA/MCA watershed. There is a small area of cortical encephalomalacia at the left posterior sylvian fissure although there appears to be some curvilinear surrounding cortical restricted diffusion or susceptibility artifact. Mildly motion degraded axial FLAIR imaging demonstrates the chronic areas of encephalomalacia with little if any FLAIR hyperintensity corresponding to the restricted diffusion. There is no intracranial mass effect. No ventriculomegaly. Basilar cisterns remain patent. IMPRESSION: 1. Right MCA and MCA/PCA watershed territory restricted diffusion suggesting core infarct beyond that estimated on the CTP earlier today. This ischemia appears concordant with the CTA findings of right ICA, right MCA bifurcation and right Pcomm origin occlusion. 2. Additional contralateral Left PCA and MCA PCA watershed territory restricted diffusion. On review of the earlier CTA, there is a paucity of flow in the distal left  P3 branches concordant with this finding, although the left P1, P2 segments and left PCA bifurcation appear normal. 3. This study discussed by telephone with Dr. Wilford CornerArora on beginning at 1127 hours. Electronically Signed   By: Odessa FlemingH  Hall M.D.   On: 2017/11/24 11:51   Ct Cerebral Perfusion W Contrast  Result Date: 2017/07/07 CLINICAL DATA:  Code stroke. 84 year old female found unresponsive with gaze deviation. Family denies knowledge of a previous stroke however, there is bilateral MCA territory ischemia which most resembles chronic encephalomalacia on the plain CT at 1028 hours today. EXAM: CT ANGIOGRAPHY HEAD AND NECK CT PERFUSION BRAIN TECHNIQUE: Multidetector CT imaging of the head and neck was performed using the standard protocol during bolus administration of intravenous contrast. Multiplanar CT image reconstructions and MIPs were obtained to evaluate the vascular anatomy. Carotid stenosis measurements (when applicable) are obtained utilizing NASCET criteria, using the distal internal carotid diameter as the denominator. Multiphase CT imaging of the brain was performed following IV bolus contrast injection. Subsequent parametric perfusion maps were calculated using RAPID software. CONTRAST:  90mL ISOVUE-370 IOPAMIDOL (ISOVUE-370) INJECTION 76% COMPARISON:  Head CT without contrast 1028 hours today. FINDINGS: CT Brain Perfusion Findings: The CTP is significantly degraded by motion artifact despite. This resulted in automatic exclusion of multiple segments from the computer analysis, including a significant portion of the arterial  input function (series 1401, image 9). CBF (<30%) Volume: 24 milliliters, predominantly in the posterior right hemisphere, but this could be unreliable due to motion. Perfusion (Tmax>6.0s) volume: Bilateral cerebellar and cerebral hemisphere abnormal T-max values up to 200 milliliters, some of this is likely artifactual. There is right greater than left MCA territory abnormal T-max.  Infarction Location:Posterior right hemisphere. CTA NECK Skeleton: Advanced cervical spine degeneration. No acute osseous abnormality identified. Upper chest: Mild motion artifact. Negative upper lungs. No superior mediastinal lymphadenopathy. Other neck: Negative; no neck mass or lymphadenopathy. Aortic arch: 3 vessel arch configuration with mild to moderate soft and calcified arch atherosclerosis. Right carotid system: Stenosis. No brachiocephalic or right CCA The right ICA is occluded origin at the distal bulb (series 6, image 94 and series 10, image 87) and remains occluded to the skull base. Left carotid system: Left CCA and proximal left ICA atherosclerosis without significant stenosis. Vertebral arteries: No proximal right subclavian artery stenosis. Mild calcified plaque at the right vertebral artery origin resulting in mild stenosis. Dominant right vertebral artery is patent to the skull base without additional stenosis. No significant proximal left subclavian artery stenosis despite soft and calcified plaque. Soft and calcified plaque near the origin of the non dominant left vertebral artery. Mild associated stenosis. The small left vertebral artery remains patent to the skull base without additional stenosis. CTA HEAD Posterior circulation: The dominant right vertebral artery supplies the basilar. The non dominant left vertebral appears to functionally terminates in PICA. The right PICA origin is patent. No distal right vertebral artery or basilar artery stenosis. Patent basilar tip. Fetal type right PCA origin, the right posterior communicating artery appears partially thrombosed as seen on series 13, image 15. Despite this, the right PCA remains patent. The distal right PCA branches arm only mildly hypoenhancing compared to the left. Normal left PCA origin. Left PCA branches are within normal limits. Anterior circulation: Patent left ICA siphon with no stenosis. Patent left ICA terminus. Normal left MCA  and ACA origins. Left MCA M1 segment, bifurcation, and left MCA branches are within normal limits. The right ICA siphon is occluded, including the anterior portion of the right posterior communicating artery which appears to constitute a fetal type PCA origin. The ACAs and anterior communicating arteries are patent, and probably supplying collateral flow to the right MCA origin. The bilateral ACA branches are within normal limits. The right MCA M1 is patent, but the right MCA bifurcation is partially occluded. Paucity of right MCA M2 and distal branch enhancement (series 13, image 10). Venous sinuses: Patent on the delayed images. Anatomic variants: Dominant right vertebral artery and fetal type right PCA origin. Delayed phase: The abnormal right greater than left MCA territory hypodensity appears stable since 1029 hours. No definite acute cytotoxic edema. No abnormal enhancement identified. Review of the MIP images confirms the above findings IMPRESSION: 1. Positive for large vessel occlusion of the right ICA, and also the right MCA bifurcation. - furthermore, there is partial occlusion of the right Pcomm which constitutes a fetal type right PCA origin. The right PCA remains patent. - there is collateral flow to the right M1 via the anterior communicating artery and ACAs. 2. Study discussed by telephone with Dr. Milon Dikes on 2017/10/12 at 1055 hours, and again at 11:17. The CT perfusion analysis is likely unreliable due to motion resulting in omission of some of the arterial input phase. We discussed that in light of the above CTA findings, I suspect the 24 mL core infarct  volume in the right hemisphere may be fairly accurate, with the right occipital lobe involvement explained by the fetal right PCA findings above. Probably then, much of the remaining right hemisphere is at risk, aside from that affected by what I suspect is a chronic right operculum infarct. Dr. Wilford Corner will attempt a stat DWI only MRI scan for  confirmation, with NIR intervention pending. 3. No superimposed hemodynamically significant stenosis in the left carotid or vertebrobasilar system. The right vertebral is dominant and supplies the basilar. Electronically Signed   By: Odessa Fleming M.D.   On: 10/10/2017 11:22   Portable Chest Xray  Result Date: 10/06/2017 CLINICAL DATA:  Stroke, ET tube EXAM: PORTABLE CHEST 1 VIEW COMPARISON:  10/15/2017 FINDINGS: Endotracheal tube is 4 cm above the carina. NG tube enters the stomach. Cardiomegaly with vascular congestion. Bibasilar opacities have increased, likely atelectasis. Possible small layering effusions. IMPRESSION: Cardiomegaly with vascular congestion. Increasing bibasilar atelectasis.  Suspect small layering effusions. Electronically Signed   By: Charlett Nose M.D.   On: 10/06/2017 08:07   Dg Chest Port 1 View  Result Date: 10/07/2017 CLINICAL DATA:  Intubation EXAM: PORTABLE CHEST 1 VIEW COMPARISON:  None. FINDINGS: Endotracheal tube is 6 cm above the carina. NG tube is in the stomach. Heart is borderline in size. Bilateral perihilar and lower lobe opacities could reflect early edema or infection. No effusions or pneumothorax. Degenerative changes in the shoulders and thoracic spine. Aortic atherosclerosis. IMPRESSION: Borderline heart size. Perihilar and lower lobe airspace opacities bilaterally could reflect edema or infection. Endotracheal tube 6 cm above the carina. Electronically Signed   By: Charlett Nose M.D.   On: 10/18/2017 12:12   Ct Head Code Stroke Wo Contrast  Result Date: 10/18/2017 CLINICAL DATA:  Code stroke. 84 year old female found unresponsive with gaze deviation. Family denies knowledge of a previous stroke. EXAM: CT HEAD WITHOUT CONTRAST TECHNIQUE: Contiguous axial images were obtained from the base of the skull through the vertex without intravenous contrast. COMPARISON:  No prior head CT. FINDINGS: Brain: There is a 3-4 centimeter area of hypodensity in the right MCA territory  centered at the insula and operculum which has a chronic appearance. Some of this demonstrates dystrophic calcification. There is no associated mass effect. There is a smaller area of cortical hypodensity along the posterior left sylvian fissure which also has a chronic appearance (sagittal image 43). No associated mass effect. No definite acute cytotoxic edema identified. No intracranial hemorrhage or mass effect. No other cortical encephalomalacia identified. No ventriculomegaly. Patent basilar cisterns. Vascular: Calcified atherosclerosis at the skull base. No suspicious intracranial vascular hyperdensity. Skull: Intermittent motion artifact. No acute osseous abnormality identified. Sinuses/Orbits: Visualized paranasal sinuses and mastoids are well pneumatized. Other: Gaze appears to be midline. Postoperative changes to both globes. No acute scalp soft tissue findings. ASPECTS [facility] Stroke Program Early CT Score) Total score (0-10 with 10 being normal): 10, allowing for bilateral MCA territory chronic appearing encephalomalacia. IMPRESSION: 1. Prior right greater than left MCA territory ischemia which is favored to be chronic. No intracranial hemorrhage, intracranial mass effect, or definite acute cytotoxic edema. 2. This was discussed by telephone with Dr. Wilford Corner on 10/04/2017 at 1055 hours. Electronically Signed   By: Odessa Fleming M.D.   On: 10/17/2017 10:59   TTE - Normal LV systolic function with grade III diastolic dysfunction.   No ASD. LA severely dilated. Moderate MR, mild-moderate TR.   Trivial AR with calcified and thickened leaflets.  LE venous doppler  Right: Portions of this  examination were limited- see technologist comments above. There is no evidence of deep vein thrombosis in the lower extremity. However, portions of this examination were limited- see technologist comments above. No cystic  structure found in the popliteal fossa. Left: Portions of this examination were limited- see  technologist comments above. There is no evidence of deep vein thrombosis in the lower extremity. However, portions of this examination were limited- see technologist comments above. non vascularized  hetergeneous area noted in the popliteal fossa, etiology unknown.   PHYSICAL EXAM  Temp:  [97.2 F (36.2 C)-102.2 F (39 C)] 97.8 F (36.6 C) (07/17 1400) Pulse Rate:  [28-98] 73 (07/17 1400) Resp:  [14-28] 15 (07/17 1400) BP: (96-201)/(50-131) 176/74 (07/17 1200) SpO2:  [94 %-100 %] 96 % (07/17 1400) Arterial Line BP: (105-170)/(48-95) 155/72 (07/17 1130) FiO2 (%):  [40 %] 40 % (07/17 1130)  General - Well nourished, well developed, intubated off sedation.  Ophthalmologic - fundi not visualized due to noncooperation.  Cardiovascular - Regular rate and rhythm.  Neuro - intubated on vent, not open eyes, off propofol, not following any commands. PERRL, eyes upper gaze position, doll's eyes absent. Absent of corneal reflex bilaterally, but positive gag. Spontaneously move RUE but not against gravity, RLE on pain stimulation 2/5. LUE flaccid and LLE 1/5 on pain stimulation. DTR 1+ and no babinski. Sensation, coordination and gait not tested.   ASSESSMENT/PLAN Ms. Kathryn Johnson is a 84 y.o. female with history of HTN, anxiety, breast cancer survivor admitted for AMS and left sided weakness. No tPA given due to OSW and possible seizure activity. Underwent IR with TICI2b revascularization.     Stroke:  bilateral infarcts with large right MCA and moderate left MCA and PCA infarcts, embolic pattern, source unclear  Resultant intubated, left hemiplegia  MRI  bilateral infarcts with large right MCA and moderate left MCA and PCA infarcts, old bilateral frontal infarcts, R larger than L  CTA head and neck - right ICA and MCA occlusion, right PCOM occlusion  DSA - RT ICA cavernous to terminus occlusion , and of the dominant RT PCOM supplying both PCAs - s/p TICI2b reperfusion  Repeat CT showed  large right MCA infarcts and left occipital infarcts  MRI and MRA cancelled as request by family  2D Echo unremarkable  LE venous doppler no DVT  LDL 85  HgbA1c 5.0  Heparin subq for VTE prophylaxis  NPO  aspirin 81 mg daily prior to admission, now on aspirin 325 mg daily.   Ongoing aggressive stroke risk factor management  Therapy recommendations:  pending  Disposition:  Pending  Family expressed pt living will of no heroic measures - today family decided on comfort care measures given poor prognosis  Right ICA and MCA occlusion  Etiology unclear  CTA head and neck - right ICA and MCA occlusion, right PCOM occlusion  DSA - RT ICA cavernous to terminus occlusion , and of the dominant RT PCOM supplying both PCAs - s/p TICI2b reperfusion  Respiratory failure  Intubated for IR procedure  Will do one way extubation for comfort care measures  CCM on board  Hypotension Stable on the high end Off neo  Hyperlipidemia  Home meds:  none   LDL 85, goal < 70  Other Stroke Risk Factors  Advanced age  Hx stroke/TIA - on images  Other Active Problems  Anxiety  Breast cancer survivor  Hypokalemia - supplement  Leukocytosis - WBC 13.4 -> 15.2    Hospital day # 2  This  patient is critically ill due to acute stroke, s/p intervention, hypotension, respiratory failure, right MCA occlusion and at significant risk of neurological worsening, death form recurrent stroke, hemorrhagic conversion, seizure, heart failure, cerebral edema. This patient's care requires constant monitoring of vital signs, hemodynamics, respiratory and cardiac monitoring, review of multiple databases, neurological assessment, discussion with family, other specialists and medical decision making of high complexity. I spent 40 minutes of neurocritical care time in the care of this patient. I had long discussion with daughter and son in law at bedside, updated pt current condition, treatment plan and  potential prognosis. They requested comfort care.     Marvel Plan, MD PhD Stroke Neurology 10/07/2017 4:35 PM    To contact Stroke Continuity provider, please refer to [email or web address]. After hours, contact General Neurology

## 2017-10-07 NOTE — Progress Notes (Signed)
PT Cancellation Note  Patient Details Name: Kathryn Johnson MRN: [id number] DOB: 10-Aug-1930   Cancelled Treatment:    Reason Eval/Treat Not Completed: Patient not medically ready per RN and critical care MD- family likely looking at comfort care. Will follow acutely for medical readiness, and decision moving forward  Van ClinesHolly Jaxson Keener, [place]PT  Acute Rehabilitation Services Pager [phone] Office [phone]      Kathryn Johnson 10/07/2017, 9:18 AM

## 2017-10-07 NOTE — Progress Notes (Signed)
   10/07/17 1254  Clinical Encounter Type  Visited With Patient and family together;Health care provider  Visit Type Patient actively dying  Referral From Nurse  Consult/Referral To Chaplain  Spiritual Encounters  Spiritual Needs Ritual;Emotional   Responded to an EOL.  Family was at the bedside and awaiting a move to comfort care.  Patient has lived with her daughter for the last 19 years.  They shared about the family and their love for the patient.  We gathered at the bedside and prayed.  Will follow and support as needed. Chaplain Agustin CreeNewton Massiah Minjares

## 2017-10-07 NOTE — Procedures (Addendum)
Extubation Procedure Note  Patient Details:   Name: Kathryn Johnson DOB: Feb 04, 1931 MRN: [id number]   Airway Documentation:    Vent end date: 10/07/17 Vent end time: 1312   Evaluation  O2 sats: stable throughout Complications: No apparent complications Patient did tolerate procedure well. Bilateral Breath Sounds: Clear   No, patient not able to speak. Patient extubated per family wishes and withdraw care. Patient appeared comfortable throughout. Procedure explained and all questions answered before procedure. RN at bedside.    Rance MuirKayla N Hacker 10/07/2017, 1:18 PM

## 2017-10-07 NOTE — Progress Notes (Signed)
Nutrition Brief Note  Chart reviewed. Pt discussed during ICU rounds and with RN.  Pt now transitioning to comfort care.  No nutrition interventions warranted at this time.  Please re-consult as needed.   Kendell BaneHeather Amos Gaber RD, LDN, CNSC [phone] Pager [phone] After Hours Pager

## 2017-10-07 NOTE — Progress Notes (Signed)
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 Corliss Skains. 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 Corliss Skains. Patient laying in bed intubated. Accompanied by daughter at bedside. Does not follow commands and does not open eyes to voice. Spontaneously moves right side, left side withdraws from pain. Right groin incision c/d/i.  Allergies: Patient has no known allergies.  Medications: Prior to Admission medications   Medication Sig Start Date End Date Taking? Authorizing Provider  amLODipine (NORVASC) 5 MG tablet Take 7.5 mg by mouth daily. 07/30/17  Yes [provider]  aspirin EC 81 MG tablet Take 81 mg by mouth daily.   Yes [provider]  AZO-CRANBERRY PO Take 1 tablet by mouth daily.   Yes [provider]  calcium carbonate (OSCAL) 1500 (600 Ca) MG TABS tablet Take 1,500 mg by mouth 2 (two) times daily with a meal.   Yes [provider]  Cholecalciferol (VITAMIN D PO) Take 1 tablet by mouth daily.   Yes [provider]  enalapril (VASOTEC) 20 MG tablet Take 20 mg by mouth 2 (two) times daily. 09/29/17  Yes [provider]  folic acid (FOLVITE) 400 MCG tablet Take 400 mcg by mouth daily.   Yes [provider]  hydrochlorothiazide (HYDRODIURIL) 25 MG tablet Take 25 mg by mouth every morning. 09/19/17  Yes [provider]  LORazepam (ATIVAN) 0.5 MG tablet Take 0.25 mg by mouth every 8 (eight) hours as needed for anxiety.   Yes [provider]  metoprolol succinate (TOPROL-XL) 100 MG 24 hr tablet Take 100 mg by mouth daily. 08/18/17  Yes [provider]  metoprolol succinate (TOPROL-XL) 25 MG 24 hr tablet Take 25 mg by mouth daily. 08/18/17  Yes [provider]  Multiple  Vitamins-Minerals (PROTEGRA PO) Take 1-2 capsules by mouth daily.   Yes [provider]  Probiotic Product (PROBIOTIC PO) Take 1 capsule by mouth daily.   Yes [provider]  psyllium (HYDROCIL/METAMUCIL) 95 % PACK Take 1 packet by mouth daily.   Yes [provider]  pyridOXINE (VITAMIN B-6) 50 MG tablet Take 50 mg by mouth daily.   Yes [provider]     Vital Signs: BP (!) 157/74   Pulse 97   Temp (!) 100.4 F (38 C) (Bladder)   Resp (!) 25   Ht 5\' 4"  (1.626 m)   Wt 165 lb 5.5 oz (75 kg)   SpO2 98%   BMI 28.38 kg/m   Physical Exam  Constitutional: She appears well-developed and well-nourished. No distress.  Intubated.  Cardiovascular: Normal rate, regular rhythm and normal heart sounds.  No murmur heard. Pulmonary/Chest: Effort normal and breath sounds normal. No respiratory distress. She has no wheezes.  Intubated.  Neurological:  Intubated and sedated, she does not follow commands and does not open eyes to voice. Speech and comprehension not assessed. PERRL and sluggish bilaterally. EOMs not assessed. Visual fields not assessed. No facial asymmetry. Tongue protrusion not assessed. Demonstrates spontaneous movements of right extremities and left extremities withdraw from pain. Pronator drift not assessed. Fine motor and coordination not assessed. Gait not assessed. Romberg not assessed. Heel to toe not assessed. Distal pulses 2+ bilaterally.   Skin: Skin is warm and dry.  Right groin incision soft without active bleeding or hematoma.  Psychiatric:  Intubated.  Nursing note and  vitals reviewed.   Imaging: Ct Angio Head W Or Wo Contrast  Result Date: 10/12/2017 CLINICAL DATA:  Code stroke. 84 year old female found unresponsive with gaze deviation. Family denies knowledge of a previous stroke however, there is bilateral MCA territory ischemia which most resembles chronic encephalomalacia on the plain CT at 1028 hours today.  EXAM: CT ANGIOGRAPHY HEAD AND NECK CT PERFUSION BRAIN TECHNIQUE: Multidetector CT imaging of the head and neck was performed using the standard protocol during bolus administration of intravenous contrast. Multiplanar CT image reconstructions and MIPs were obtained to evaluate the vascular anatomy. Carotid stenosis measurements (when applicable) are obtained utilizing NASCET criteria, using the distal internal carotid diameter as the denominator. Multiphase CT imaging of the brain was performed following IV bolus contrast injection. Subsequent parametric perfusion maps were calculated using RAPID software. CONTRAST:  90mL ISOVUE-370 IOPAMIDOL (ISOVUE-370) INJECTION 76% COMPARISON:  Head CT without contrast 1028 hours today. FINDINGS: CT Brain Perfusion Findings: The CTP is significantly degraded by motion artifact despite. This resulted in automatic exclusion of multiple segments from the computer analysis, including a significant portion of the arterial input function (series 1401, image 9). CBF (<30%) Volume: 24 milliliters, predominantly in the posterior right hemisphere, but this could be unreliable due to motion. Perfusion (Tmax>6.0s) volume: Bilateral cerebellar and cerebral hemisphere abnormal T-max values up to 200 milliliters, some of this is likely artifactual. There is right greater than left MCA territory abnormal T-max. Infarction Location:Posterior right hemisphere. CTA NECK Skeleton: Advanced cervical spine degeneration. No acute osseous abnormality identified. Upper chest: Mild motion artifact. Negative upper lungs. No superior mediastinal lymphadenopathy. Other neck: Negative; no neck mass or lymphadenopathy. Aortic arch: 3 vessel arch configuration with mild to moderate soft and calcified arch atherosclerosis. Right carotid system: Stenosis. No brachiocephalic or right CCA The right ICA is occluded origin at the distal bulb (series 6, image 94 and series 10, image 87) and remains occluded to the  skull base. Left carotid system: Left CCA and proximal left ICA atherosclerosis without significant stenosis. Vertebral arteries: No proximal right subclavian artery stenosis. Mild calcified plaque at the right vertebral artery origin resulting in mild stenosis. Dominant right vertebral artery is patent to the skull base without additional stenosis. No significant proximal left subclavian artery stenosis despite soft and calcified plaque. Soft and calcified plaque near the origin of the non dominant left vertebral artery. Mild associated stenosis. The small left vertebral artery remains patent to the skull base without additional stenosis. CTA HEAD Posterior circulation: The dominant right vertebral artery supplies the basilar. The non dominant left vertebral appears to functionally terminates in PICA. The right PICA origin is patent. No distal right vertebral artery or basilar artery stenosis. Patent basilar tip. Fetal type right PCA origin, the right posterior communicating artery appears partially thrombosed as seen on series 13, image 15. Despite this, the right PCA remains patent. The distal right PCA branches arm only mildly hypoenhancing compared to the left. Normal left PCA origin. Left PCA branches are within normal limits. Anterior circulation: Patent left ICA siphon with no stenosis. Patent left ICA terminus. Normal left MCA and ACA origins. Left MCA M1 segment, bifurcation, and left MCA branches are within normal limits. The right ICA siphon is occluded, including the anterior portion of the right posterior communicating artery which appears to constitute a fetal type PCA origin. The ACAs and anterior communicating arteries are patent, and probably supplying collateral flow to the right MCA origin. The bilateral ACA branches are within normal limits. The right  MCA M1 is patent, but the right MCA bifurcation is partially occluded. Paucity of right MCA M2 and distal branch enhancement (series 13, image  10). Venous sinuses: Patent on the delayed images. Anatomic variants: Dominant right vertebral artery and fetal type right PCA origin. Delayed phase: The abnormal right greater than left MCA territory hypodensity appears stable since 1029 hours. No definite acute cytotoxic edema. No abnormal enhancement identified. Review of the MIP images confirms the above findings IMPRESSION: 1. Positive for large vessel occlusion of the right ICA, and also the right MCA bifurcation. - furthermore, there is partial occlusion of the right Pcomm which constitutes a fetal type right PCA origin. The right PCA remains patent. - there is collateral flow to the right M1 via the anterior communicating artery and ACAs. 2. Study discussed by telephone with Dr. Milon DikesASHISH ARORA on 10/03/2017 at 1055 hours, and again at 11:17. The CT perfusion analysis is likely unreliable due to motion resulting in omission of some of the arterial input phase. We discussed that in light of the above CTA findings, I suspect the 24 mL core infarct volume in the right hemisphere may be fairly accurate, with the right occipital lobe involvement explained by the fetal right PCA findings above. Probably then, much of the remaining right hemisphere is at risk, aside from that affected by what I suspect is a chronic right operculum infarct. Dr. Wilford CornerArora will attempt a stat DWI only MRI scan for confirmation, with NIR intervention pending. 3. No superimposed hemodynamically significant stenosis in the left carotid or vertebrobasilar system. The right vertebral is dominant and supplies the basilar. Electronically Signed   By: Odessa FlemingH  Hall M.D.   On: 10/10/2017 11:22   Ct Head Wo Contrast  Result Date: 10/06/2017 CLINICAL DATA:  Altered level of consciousness EXAM: CT HEAD WITHOUT CONTRAST TECHNIQUE: Contiguous axial images were obtained from the base of the skull through the vertex without intravenous contrast. COMPARISON:  10/10/2017 FINDINGS: Brain: Motion artifact degrades  image quality. There is new diffuse low-density noted throughout much of the right cerebral hemisphere, most notable in the temporal, parietal and occipital lobes. New low-density also noted posteriorly in the left occipital lobe and posterior parietal lobe. Findings are compatible with acute infarctions. Old infarcts noted in the right frontotemporal lobe with associated parenchymal calcifications and in the left posterior frontal lobe. No hydrocephalus or hemorrhage. Vascular: No evidence of aneurysm or adenopathy. Skull: No acute calvarial abnormality. Sinuses/Orbits: Visualized paranasal sinuses and mastoids clear. Orbital soft tissues unremarkable. Other: None IMPRESSION: New large acute infarcts involving much of the mid and posterior right cerebral hemisphere as well as the left occipital lobe. 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 No hemorrhage. Electronically Signed   By: Charlett NoseKevin  Dover M.D.   On: 10/06/2017 08:32   Ct Angio Neck W Or Wo Contrast  Result Date: 09/26/2017 CLINICAL DATA:  Code stroke. 84 year old female found unresponsive with gaze deviation. Family denies knowledge of a previous stroke however, there is bilateral MCA territory ischemia which most resembles chronic encephalomalacia on the plain CT at 1028 hours today. EXAM: CT ANGIOGRAPHY HEAD AND NECK CT PERFUSION BRAIN TECHNIQUE: Multidetector CT imaging of the head and neck was performed using the standard protocol during bolus administration of intravenous contrast. Multiplanar CT image reconstructions and MIPs were obtained to evaluate the vascular anatomy. Carotid stenosis measurements (when applicable) are obtained utilizing NASCET criteria, using the distal internal carotid diameter as the denominator. Multiphase CT imaging of the brain was performed following  IV bolus contrast injection. Subsequent parametric perfusion maps were calculated using RAPID software. CONTRAST:  90mL ISOVUE-370 IOPAMIDOL (ISOVUE-370)  INJECTION 76% COMPARISON:  Head CT without contrast 1028 hours today. FINDINGS: CT Brain Perfusion Findings: The CTP is significantly degraded by motion artifact despite. This resulted in automatic exclusion of multiple segments from the computer analysis, including a significant portion of the arterial input function (series 1401, image 9). CBF (<30%) Volume: 24 milliliters, predominantly in the posterior right hemisphere, but this could be unreliable due to motion. Perfusion (Tmax>6.0s) volume: Bilateral cerebellar and cerebral hemisphere abnormal T-max values up to 200 milliliters, some of this is likely artifactual. There is right greater than left MCA territory abnormal T-max. Infarction Location:Posterior right hemisphere. CTA NECK Skeleton: Advanced cervical spine degeneration. No acute osseous abnormality identified. Upper chest: Mild motion artifact. Negative upper lungs. No superior mediastinal lymphadenopathy. Other neck: Negative; no neck mass or lymphadenopathy. Aortic arch: 3 vessel arch configuration with mild to moderate soft and calcified arch atherosclerosis. Right carotid system: Stenosis. No brachiocephalic or right CCA The right ICA is occluded origin at the distal bulb (series 6, image 94 and series 10, image 87) and remains occluded to the skull base. Left carotid system: Left CCA and proximal left ICA atherosclerosis without significant stenosis. Vertebral arteries: No proximal right subclavian artery stenosis. Mild calcified plaque at the right vertebral artery origin resulting in mild stenosis. Dominant right vertebral artery is patent to the skull base without additional stenosis. No significant proximal left subclavian artery stenosis despite soft and calcified plaque. Soft and calcified plaque near the origin of the non dominant left vertebral artery. Mild associated stenosis. The small left vertebral artery remains patent to the skull base without additional stenosis. CTA HEAD Posterior  circulation: The dominant right vertebral artery supplies the basilar. The non dominant left vertebral appears to functionally terminates in PICA. The right PICA origin is patent. No distal right vertebral artery or basilar artery stenosis. Patent basilar tip. Fetal type right PCA origin, the right posterior communicating artery appears partially thrombosed as seen on series 13, image 15. Despite this, the right PCA remains patent. The distal right PCA branches arm only mildly hypoenhancing compared to the left. Normal left PCA origin. Left PCA branches are within normal limits. Anterior circulation: Patent left ICA siphon with no stenosis. Patent left ICA terminus. Normal left MCA and ACA origins. Left MCA M1 segment, bifurcation, and left MCA branches are within normal limits. The right ICA siphon is occluded, including the anterior portion of the right posterior communicating artery which appears to constitute a fetal type PCA origin. The ACAs and anterior communicating arteries are patent, and probably supplying collateral flow to the right MCA origin. The bilateral ACA branches are within normal limits. The right MCA M1 is patent, but the right MCA bifurcation is partially occluded. Paucity of right MCA M2 and distal branch enhancement (series 13, image 10). Venous sinuses: Patent on the delayed images. Anatomic variants: Dominant right vertebral artery and fetal type right PCA origin. Delayed phase: The abnormal right greater than left MCA territory hypodensity appears stable since 1029 hours. No definite acute cytotoxic edema. No abnormal enhancement identified. Review of the MIP images confirms the above findings IMPRESSION: 1. Positive for large vessel occlusion of the right ICA, and also the right MCA bifurcation. - furthermore, there is partial occlusion of the right Pcomm which constitutes a fetal type right PCA origin. The right PCA remains patent. - there is collateral flow to the right  M1 via the  anterior communicating artery and ACAs. 2. Study discussed by telephone with Dr. Milon Dikes on 09/23/2017 at 1055 hours, and again at 11:17. The CT perfusion analysis is likely unreliable due to motion resulting in omission of some of the arterial input phase. We discussed that in light of the above CTA findings, I suspect the 24 mL core infarct volume in the right hemisphere may be fairly accurate, with the right occipital lobe involvement explained by the fetal right PCA findings above. Probably then, much of the remaining right hemisphere is at risk, aside from that affected by what I suspect is a chronic right operculum infarct. Dr. Wilford Corner will attempt a stat DWI only MRI scan for confirmation, with NIR intervention pending. 3. No superimposed hemodynamically significant stenosis in the left carotid or vertebrobasilar system. The right vertebral is dominant and supplies the basilar. Electronically Signed   By: Odessa Fleming M.D.   On: 10/04/2017 11:22   Mr Brain Wo Contrast  Result Date: 09/26/2017 CLINICAL DATA:  84 year old female is poorly responsive with right ICA, right MCA bifurcation, and right Pcomm/PCA origin large vessel occlusion on CTA. CT perfusion affected by motion artifact, and confounding chronic appearing right greater than left MCA territory infarcts on presentation noncontrast head CT. EXAM: MRI HEAD WITHOUT CONTRAST LIMITED TECHNIQUE: Diffusion and FLAIR pulse sequences of the brain and surrounding structures were obtained without intravenous contrast. COMPARISON:  Head CT, CTA and CT perfusion earlier today. FINDINGS: Axial diffusion weighted imaging demonstrates confluent abnormal signal in the posterior right hemisphere corresponding to the right MCA and right MCA/PCA watershed territory. On ADC much of this appears restricted on diffusion. Superimposed chronic encephalomalacia at the right frontal operculum. Superimposed cortical and subcortical white matter restricted diffusion in the  left PCA territory and left PCA/MCA watershed. There is a small area of cortical encephalomalacia at the left posterior sylvian fissure although there appears to be some curvilinear surrounding cortical restricted diffusion or susceptibility artifact. Mildly motion degraded axial FLAIR imaging demonstrates the chronic areas of encephalomalacia with little if any FLAIR hyperintensity corresponding to the restricted diffusion. There is no intracranial mass effect. No ventriculomegaly. Basilar cisterns remain patent. IMPRESSION: 1. Right MCA and MCA/PCA watershed territory restricted diffusion suggesting core infarct beyond that estimated on the CTP earlier today. This ischemia appears concordant with the CTA findings of right ICA, right MCA bifurcation and right Pcomm origin occlusion. 2. Additional contralateral Left PCA and MCA PCA watershed territory restricted diffusion. On review of the earlier CTA, there is a paucity of flow in the distal left P3 branches concordant with this finding, although the left P1, P2 segments and left PCA bifurcation appear normal. 3. This study discussed by telephone with Dr. Wilford Corner on beginning at 1127 hours. Electronically Signed   By: Odessa Fleming M.D.   On: 10/20/2017 11:51   Ct Cerebral Perfusion W Contrast  Result Date: 10/01/2017 CLINICAL DATA:  Code stroke. 84 year old female found unresponsive with gaze deviation. Family denies knowledge of a previous stroke however, there is bilateral MCA territory ischemia which most resembles chronic encephalomalacia on the plain CT at 1028 hours today. EXAM: CT ANGIOGRAPHY HEAD AND NECK CT PERFUSION BRAIN TECHNIQUE: Multidetector CT imaging of the head and neck was performed using the standard protocol during bolus administration of intravenous contrast. Multiplanar CT image reconstructions and MIPs were obtained to evaluate the vascular anatomy. Carotid stenosis measurements (when applicable) are obtained utilizing NASCET criteria, using  the distal internal carotid diameter as the  denominator. Multiphase CT imaging of the brain was performed following IV bolus contrast injection. Subsequent parametric perfusion maps were calculated using RAPID software. CONTRAST:  90mL ISOVUE-370 IOPAMIDOL (ISOVUE-370) INJECTION 76% COMPARISON:  Head CT without contrast 1028 hours today. FINDINGS: CT Brain Perfusion Findings: The CTP is significantly degraded by motion artifact despite. This resulted in automatic exclusion of multiple segments from the computer analysis, including a significant portion of the arterial input function (series 1401, image 9). CBF (<30%) Volume: 24 milliliters, predominantly in the posterior right hemisphere, but this could be unreliable due to motion. Perfusion (Tmax>6.0s) volume: Bilateral cerebellar and cerebral hemisphere abnormal T-max values up to 200 milliliters, some of this is likely artifactual. There is right greater than left MCA territory abnormal T-max. Infarction Location:Posterior right hemisphere. CTA NECK Skeleton: Advanced cervical spine degeneration. No acute osseous abnormality identified. Upper chest: Mild motion artifact. Negative upper lungs. No superior mediastinal lymphadenopathy. Other neck: Negative; no neck mass or lymphadenopathy. Aortic arch: 3 vessel arch configuration with mild to moderate soft and calcified arch atherosclerosis. Right carotid system: Stenosis. No brachiocephalic or right CCA The right ICA is occluded origin at the distal bulb (series 6, image 94 and series 10, image 87) and remains occluded to the skull base. Left carotid system: Left CCA and proximal left ICA atherosclerosis without significant stenosis. Vertebral arteries: No proximal right subclavian artery stenosis. Mild calcified plaque at the right vertebral artery origin resulting in mild stenosis. Dominant right vertebral artery is patent to the skull base without additional stenosis. No significant proximal left subclavian  artery stenosis despite soft and calcified plaque. Soft and calcified plaque near the origin of the non dominant left vertebral artery. Mild associated stenosis. The small left vertebral artery remains patent to the skull base without additional stenosis. CTA HEAD Posterior circulation: The dominant right vertebral artery supplies the basilar. The non dominant left vertebral appears to functionally terminates in PICA. The right PICA origin is patent. No distal right vertebral artery or basilar artery stenosis. Patent basilar tip. Fetal type right PCA origin, the right posterior communicating artery appears partially thrombosed as seen on series 13, image 15. Despite this, the right PCA remains patent. The distal right PCA branches arm only mildly hypoenhancing compared to the left. Normal left PCA origin. Left PCA branches are within normal limits. Anterior circulation: Patent left ICA siphon with no stenosis. Patent left ICA terminus. Normal left MCA and ACA origins. Left MCA M1 segment, bifurcation, and left MCA branches are within normal limits. The right ICA siphon is occluded, including the anterior portion of the right posterior communicating artery which appears to constitute a fetal type PCA origin. The ACAs and anterior communicating arteries are patent, and probably supplying collateral flow to the right MCA origin. The bilateral ACA branches are within normal limits. The right MCA M1 is patent, but the right MCA bifurcation is partially occluded. Paucity of right MCA M2 and distal branch enhancement (series 13, image 10). Venous sinuses: Patent on the delayed images. Anatomic variants: Dominant right vertebral artery and fetal type right PCA origin. Delayed phase: The abnormal right greater than left MCA territory hypodensity appears stable since 1029 hours. No definite acute cytotoxic edema. No abnormal enhancement identified. Review of the MIP images confirms the above findings IMPRESSION: 1. Positive  for large vessel occlusion of the right ICA, and also the right MCA bifurcation. - furthermore, there is partial occlusion of the right Pcomm which constitutes a fetal type right PCA origin. The right PCA  remains patent. - there is collateral flow to the right M1 via the anterior communicating artery and ACAs. 2. Study discussed by telephone with Dr. Milon Dikes on 25-Oct-2017 at 1055 hours, and again at 11:17. The CT perfusion analysis is likely unreliable due to motion resulting in omission of some of the arterial input phase. We discussed that in light of the above CTA findings, I suspect the 24 mL core infarct volume in the right hemisphere may be fairly accurate, with the right occipital lobe involvement explained by the fetal right PCA findings above. Probably then, much of the remaining right hemisphere is at risk, aside from that affected by what I suspect is a chronic right operculum infarct. Dr. Wilford Corner will attempt a stat DWI only MRI scan for confirmation, with NIR intervention pending. 3. No superimposed hemodynamically significant stenosis in the left carotid or vertebrobasilar system. The right vertebral is dominant and supplies the basilar. Electronically Signed   By: Odessa Fleming M.D.   On: 10-25-17 11:22   Portable Chest Xray  Result Date: 10/06/2017 CLINICAL DATA:  Stroke, ET tube EXAM: PORTABLE CHEST 1 VIEW COMPARISON:  10-25-2017 FINDINGS: Endotracheal tube is 4 cm above the carina. NG tube enters the stomach. Cardiomegaly with vascular congestion. Bibasilar opacities have increased, likely atelectasis. Possible small layering effusions. IMPRESSION: Cardiomegaly with vascular congestion. Increasing bibasilar atelectasis.  Suspect small layering effusions. Electronically Signed   By: Charlett Nose M.D.   On: 10/06/2017 08:07   Dg Chest Port 1 View  Result Date: 10-25-17 CLINICAL DATA:  Intubation EXAM: PORTABLE CHEST 1 VIEW COMPARISON:  None. FINDINGS: Endotracheal tube is 6 cm above the  carina. NG tube is in the stomach. Heart is borderline in size. Bilateral perihilar and lower lobe opacities could reflect early edema or infection. No effusions or pneumothorax. Degenerative changes in the shoulders and thoracic spine. Aortic atherosclerosis. IMPRESSION: Borderline heart size. Perihilar and lower lobe airspace opacities bilaterally could reflect edema or infection. Endotracheal tube 6 cm above the carina. Electronically Signed   By: Charlett Nose M.D.   On: 2017-10-25 12:12   Ct Head Code Stroke Wo Contrast  Result Date: 2017/10/25 CLINICAL DATA:  Code stroke. 84 year old female found unresponsive with gaze deviation. Family denies knowledge of a previous stroke. EXAM: CT HEAD WITHOUT CONTRAST TECHNIQUE: Contiguous axial images were obtained from the base of the skull through the vertex without intravenous contrast. COMPARISON:  No prior head CT. FINDINGS: Brain: There is a 3-4 centimeter area of hypodensity in the right MCA territory centered at the insula and operculum which has a chronic appearance. Some of this demonstrates dystrophic calcification. There is no associated mass effect. There is a smaller area of cortical hypodensity along the posterior left sylvian fissure which also has a chronic appearance (sagittal image 43). No associated mass effect. No definite acute cytotoxic edema identified. No intracranial hemorrhage or mass effect. No other cortical encephalomalacia identified. No ventriculomegaly. Patent basilar cisterns. Vascular: Calcified atherosclerosis at the skull base. No suspicious intracranial vascular hyperdensity. Skull: Intermittent motion artifact. No acute osseous abnormality identified. Sinuses/Orbits: Visualized paranasal sinuses and mastoids are well pneumatized. Other: Gaze appears to be midline. Postoperative changes to both globes. No acute scalp soft tissue findings. ASPECTS [facility] Stroke Program Early CT Score) Total score (0-10 with 10 being normal): 10,  allowing for bilateral MCA territory chronic appearing encephalomalacia. IMPRESSION: 1. Prior right greater than left MCA territory ischemia which is favored to be chronic. No intracranial hemorrhage, intracranial mass effect, or definite  acute cytotoxic edema. 2. This was discussed by telephone with Dr. Wilford Corner on 10/01/2017 at 1055 hours. Electronically Signed   By: Odessa Fleming M.D.   On: 09/21/2017 10:59    Labs:  CBC: Recent Labs    10/07/2017 1011 09/24/2017 1017 10/06/17 0315 10/07/17 0405  WBC 14.5*  --  13.4* 15.2*  HGB 12.9 15.3* 9.3* 9.2*  HCT 39.2 45.0 29.2* 28.3*  PLT 365  --  148* 140*    COAGS: Recent Labs    10/11/2017 1011  INR 1.04  APTT 31    BMP: Recent Labs    10/04/2017 1011 09/28/2017 1017 10/06/2017 1711 10/06/17 0315 10/07/17 0405  NA 129* 128* 131* 134* 136  K 3.0* 3.0* 2.4* 3.1* 3.2*  CL 91* 91* 99 103 108  CO2 25  --  23 23 21*  GLUCOSE 125* 125* 113* 101* 100*  BUN 15 16 11 10 8   CALCIUM 9.5  --  7.0* 7.4* 7.7*  CREATININE 0.64 0.50 0.53 0.57 0.52  GFRNONAA >60  --  >60 >60 >60  GFRAA >60  --  >60 >60 >60    LIVER FUNCTION TESTS: Recent Labs    10/04/2017 1011 09/30/2017 1711 10/06/17 0315  BILITOT 1.7*  --   --   AST 23  --   --   ALT 19  --   --   ALKPHOS 62  --   --   PROT 7.6  --   --   ALBUMIN 4.2 2.8* 2.8*    Assessment and Plan:  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 Corliss Skains. 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 Corliss Skains. Patient's condition stable at this time- has spontaneous movement of right side and left side withdraws from pain. Right groin incision stable. Appreciate and agree with neurology management.   Electronically Signed: Elwin Mocha, PA-C 10/07/2017, 10:00 AM   I spent a total of 15 Minutes at the the patient's bedside AND on the patient's hospital floor or unit, greater than 50% of which was counseling/coordinating care for right ICA  cavernous to terminus occlusion s/p revascularization AND right PCOM artery occlusion s/p revascularization.

## 2017-10-07 NOTE — Progress Notes (Signed)
Jackqulyn LivingsMarion Johnson is a 84 y.o. female patient admitted. Marland Kitchen.  VSS - Blood pressure (Abnormal) 157/69, pulse 70, temperature 98.3 F (36.8 C), temperature source Oral, resp. rate 12, height 5\' 4"  (1.626 m), weight 75 kg (165 lb 5.5 oz), SpO2 91 %. on RA. IV in place, occlusive dsg intact without redness. Morphine drip in place. Patient resting comfortably.    Will cont to eval and treat per MD orders.  Alonza BogusKristie G Farrell Pantaleo, RN 10/07/2017 6:43 PM

## 2017-10-07 NOTE — Progress Notes (Signed)
OT Cancellation Note  Patient Details Name: Kathryn Johnson MRN: [id number] DOB: 1931-01-26   Cancelled Treatment:    Reason Eval/Treat Not Completed: Patient not medically ready. Per RN and critical care MD- family likely looking at comfort care. Will follow acutely for medical readiness, and decision moving forward  Emelda FearLaura J Izabella Marcantel 10/07/2017, 9:10 AM  Sherryl MangesLaura Wiley Flicker OTR/L [phone]

## 2017-10-08 DIAGNOSIS — Z7189 Other specified counseling: Secondary | ICD-10-CM

## 2017-10-08 DIAGNOSIS — I639 Cerebral infarction, unspecified: Secondary | ICD-10-CM

## 2017-10-08 DIAGNOSIS — Z515 Encounter for palliative care: Secondary | ICD-10-CM

## 2017-10-08 DIAGNOSIS — I6389 Other cerebral infarction: Secondary | ICD-10-CM

## 2017-10-08 LAB — CULTURE, RESPIRATORY W GRAM STAIN: Culture: NORMAL

## 2017-10-08 LAB — CULTURE, RESPIRATORY

## 2017-10-08 MED ORDER — POLYVINYL ALCOHOL 1.4 % OP SOLN: 1.0000 [drp] | Freq: Four times a day (QID) | OPHTHALMIC | Status: DC | PRN

## 2017-10-08 MED ORDER — MORPHINE BOLUS VIA INFUSION
4.0000 mg | INTRAVENOUS | Status: DC | PRN
  Administered 2017-10-08: 4 mg via INTRAVENOUS
  Filled 2017-10-08: qty 4

## 2017-10-08 MED ORDER — LORAZEPAM 2 MG/ML IJ SOLN: 2.0000 mg | INTRAMUSCULAR | Status: DC | PRN

## 2017-10-08 MED ORDER — SCOPOLAMINE 1 MG/3DAYS TD PT72
1.0000 | MEDICATED_PATCH | TRANSDERMAL | Status: DC
  Administered 2017-10-08: 1.5 mg via TRANSDERMAL
  Filled 2017-10-08: qty 1

## 2017-10-10 LAB — CULTURE, BLOOD (ROUTINE X 2)
CULTURE: NO GROWTH
Culture: NO GROWTH
SPECIAL REQUESTS: ADEQUATE

## 2017-10-10 NOTE — Anesthesia Postprocedure Evaluation (Signed)
Anesthesia Post Note  Patient: Kathryn Johnson  Procedure(s) Performed: IR WITH ANESTHESIA CODE STROKE WITH ANESTHESIA (N/A )     Patient location during evaluation: SICU Anesthesia Type: General Level of consciousness: sedated Pain management: pain level controlled Vital Signs Assessment: post-procedure vital signs reviewed and stable Respiratory status: patient remains intubated per anesthesia plan Cardiovascular status: stable Postop Assessment: no apparent nausea or vomiting Anesthetic complications: no    Last Vitals:  Vitals:   10/07/17 1830 10/03/2017 0532  BP: (!) 157/69 98/76  Pulse: 70 (!) 113  Resp: 12 18  Temp: 36.8 C 37.5 C  SpO2: 91%     Last Pain:  Vitals:   09/22/2017 0532  TempSrc: Axillary  PainSc:                  Kathryn Johnson

## 2017-10-12 ENCOUNTER — Encounter (HOSPITAL_COMMUNITY): Payer: Self-pay | Admitting: Interventional Radiology

## 2017-10-13 ENCOUNTER — Other Ambulatory Visit (HOSPITAL_COMMUNITY): Payer: Self-pay | Admitting: Interventional Radiology

## 2017-10-13 ENCOUNTER — Encounter (HOSPITAL_COMMUNITY): Payer: Self-pay

## 2017-10-13 DIAGNOSIS — I639 Cerebral infarction, unspecified: Secondary | ICD-10-CM

## 2017-10-13 DIAGNOSIS — I6389 Other cerebral infarction: Secondary | ICD-10-CM

## 2017-10-22 NOTE — Death Summary Note (Signed)
Stroke Discharge Summary  Patient ID: Kathryn Johnson   MRN: [id number]      DOB: Nov 25, 1930  Date of Admission: 10/06/2017 Date of Discharge: October 15, 2017  Attending Physician:  Marvel Plan, MD, Stroke MD Consultant(s):    pulmonary/intensive care, interventional radiology Patient's PCP:  ViaCaryn Bee, MD  DISCHARGE DIAGNOSIS:  Active Problems:   Acute ischemic stroke [facility])   Cerebral edema   Middle cerebral artery embolism, right   Carotid artery occlusion, right   HTN   HLD   Allergies as of 10-15-17   No Known Allergies     LABORATORY STUDIES CBC    Component Value Date/Time   WBC 15.2 (H) 10/07/2017 0405   RBC 4.19 10/07/2017 0405   HGB 9.2 (L) 10/07/2017 0405   HCT 28.3 (L) 10/07/2017 0405   PLT 140 (L) 10/07/2017 0405   MCV 67.5 (L) 10/07/2017 0405   MCH 22.0 (L) 10/07/2017 0405   MCHC 32.5 10/07/2017 0405   RDW 19.9 (H) 10/07/2017 0405   LYMPHSABS 1.2 10/07/2017 0405   MONOABS 1.2 (H) 10/07/2017 0405   EOSABS 0.0 10/07/2017 0405   BASOSABS 0.0 10/07/2017 0405   CMP    Component Value Date/Time   NA 136 10/07/2017 0405   K 3.2 (L) 10/07/2017 0405   CL 108 10/07/2017 0405   CO2 21 (L) 10/07/2017 0405   GLUCOSE 100 (H) 10/07/2017 0405   BUN 8 10/07/2017 0405   CREATININE 0.52 10/07/2017 0405   CALCIUM 7.7 (L) 10/07/2017 0405   PROT 7.6 10/14/2017 1011   ALBUMIN 2.8 (L) 10/06/2017 0315   AST 23 09/30/2017 1011   ALT 19 09/23/2017 1011   ALKPHOS 62 09/26/2017 1011   BILITOT 1.7 (H) 10/12/2017 1011   GFRNONAA >60 10/07/2017 0405   GFRAA >60 10/07/2017 0405   COAGS Lab Results  Component Value Date   INR 1.04 10/07/2017   Lipid Panel    Component Value Date/Time   CHOL 142 10/06/2017 0315   TRIG 149 10/06/2017 0315   HDL 27 (L) 10/06/2017 0315   CHOLHDL 5.3 10/06/2017 0315   VLDL 30 10/06/2017 0315   LDLCALC 85 10/06/2017 0315   HgbA1C  Lab Results  Component Value Date   HGBA1C 5.0 10/06/2017   Urinalysis No results found for:  COLORURINE, APPEARANCEUR, LABSPEC, PHURINE, GLUCOSEU, HGBUR, BILIRUBINUR, KETONESUR, PROTEINUR, UROBILINOGEN, NITRITE, LEUKOCYTESUR Urine Drug Screen No results found for: LABOPIA, COCAINSCRNUR, LABBENZ, AMPHETMU, THCU, LABBARB  Alcohol Level    Component Value Date/Time   ETH <10 10/14/2017 1011     SIGNIFICANT DIAGNOSTIC STUDIES Ct Angio Head W Or Wo Contrast  Result Date: 09/25/2017 CLINICAL DATA:  Code stroke. 84 year old female found unresponsive with gaze deviation. Family denies knowledge of a previous stroke however, there is bilateral MCA territory ischemia which most resembles chronic encephalomalacia on the plain CT at 1028 hours today. EXAM: CT ANGIOGRAPHY HEAD AND NECK CT PERFUSION BRAIN TECHNIQUE: Multidetector CT imaging of the head and neck was performed using the standard protocol during bolus administration of intravenous contrast. Multiplanar CT image reconstructions and MIPs were obtained to evaluate the vascular anatomy. Carotid stenosis measurements (when applicable) are obtained utilizing NASCET criteria, using the distal internal carotid diameter as the denominator. Multiphase CT imaging of the brain was performed following IV bolus contrast injection. Subsequent parametric perfusion maps were calculated using RAPID software. CONTRAST:  90mL ISOVUE-370 IOPAMIDOL (ISOVUE-370) INJECTION 76% COMPARISON:  Head CT without contrast 1028 hours today. FINDINGS: CT Brain Perfusion Findings: The CTP  is significantly degraded by motion artifact despite. This resulted in automatic exclusion of multiple segments from the computer analysis, including a significant portion of the arterial input function (series 1401, image 9). CBF (<30%) Volume: 24 milliliters, predominantly in the posterior right hemisphere, but this could be unreliable due to motion. Perfusion (Tmax>6.0s) volume: Bilateral cerebellar and cerebral hemisphere abnormal T-max values up to 200 milliliters, some of this is likely  artifactual. There is right greater than left MCA territory abnormal T-max. Infarction Location:Posterior right hemisphere. CTA NECK Skeleton: Advanced cervical spine degeneration. No acute osseous abnormality identified. Upper chest: Mild motion artifact. Negative upper lungs. No superior mediastinal lymphadenopathy. Other neck: Negative; no neck mass or lymphadenopathy. Aortic arch: 3 vessel arch configuration with mild to moderate soft and calcified arch atherosclerosis. Right carotid system: Stenosis. No brachiocephalic or right CCA The right ICA is occluded origin at the distal bulb (series 6, image 94 and series 10, image 87) and remains occluded to the skull base. Left carotid system: Left CCA and proximal left ICA atherosclerosis without significant stenosis. Vertebral arteries: No proximal right subclavian artery stenosis. Mild calcified plaque at the right vertebral artery origin resulting in mild stenosis. Dominant right vertebral artery is patent to the skull base without additional stenosis. No significant proximal left subclavian artery stenosis despite soft and calcified plaque. Soft and calcified plaque near the origin of the non dominant left vertebral artery. Mild associated stenosis. The small left vertebral artery remains patent to the skull base without additional stenosis. CTA HEAD Posterior circulation: The dominant right vertebral artery supplies the basilar. The non dominant left vertebral appears to functionally terminates in PICA. The right PICA origin is patent. No distal right vertebral artery or basilar artery stenosis. Patent basilar tip. Fetal type right PCA origin, the right posterior communicating artery appears partially thrombosed as seen on series 13, image 15. Despite this, the right PCA remains patent. The distal right PCA branches arm only mildly hypoenhancing compared to the left. Normal left PCA origin. Left PCA branches are within normal limits. Anterior circulation: Patent  left ICA siphon with no stenosis. Patent left ICA terminus. Normal left MCA and ACA origins. Left MCA M1 segment, bifurcation, and left MCA branches are within normal limits. The right ICA siphon is occluded, including the anterior portion of the right posterior communicating artery which appears to constitute a fetal type PCA origin. The ACAs and anterior communicating arteries are patent, and probably supplying collateral flow to the right MCA origin. The bilateral ACA branches are within normal limits. The right MCA M1 is patent, but the right MCA bifurcation is partially occluded. Paucity of right MCA M2 and distal branch enhancement (series 13, image 10). Venous sinuses: Patent on the delayed images. Anatomic variants: Dominant right vertebral artery and fetal type right PCA origin. Delayed phase: The abnormal right greater than left MCA territory hypodensity appears stable since 1029 hours. No definite acute cytotoxic edema. No abnormal enhancement identified. Review of the MIP images confirms the above findings IMPRESSION: 1. Positive for large vessel occlusion of the right ICA, and also the right MCA bifurcation. - furthermore, there is partial occlusion of the right Pcomm which constitutes a fetal type right PCA origin. The right PCA remains patent. - there is collateral flow to the right M1 via the anterior communicating artery and ACAs. 2. Study discussed by telephone with Dr. Milon Dikes on 09/21/2017 at 1055 hours, and again at 11:17. The CT perfusion analysis is likely unreliable due to motion resulting  in omission of some of the arterial input phase. We discussed that in light of the above CTA findings, I suspect the 24 mL core infarct volume in the right hemisphere may be fairly accurate, with the right occipital lobe involvement explained by the fetal right PCA findings above. Probably then, much of the remaining right hemisphere is at risk, aside from that affected by what I suspect is a chronic  right operculum infarct. Dr. Wilford Corner will attempt a stat DWI only MRI scan for confirmation, with NIR intervention pending. 3. No superimposed hemodynamically significant stenosis in the left carotid or vertebrobasilar system. The right vertebral is dominant and supplies the basilar. Electronically Signed   By: Odessa Fleming M.D.   On: 10/18/2017 11:22   Ct Head Wo Contrast  Result Date: 10/06/2017 CLINICAL DATA:  Altered level of consciousness EXAM: CT HEAD WITHOUT CONTRAST TECHNIQUE: Contiguous axial images were obtained from the base of the skull through the vertex without intravenous contrast. COMPARISON:  10/02/2017 FINDINGS: Brain: Motion artifact degrades image quality. There is new diffuse low-density noted throughout much of the right cerebral hemisphere, most notable in the temporal, parietal and occipital lobes. New low-density also noted posteriorly in the left occipital lobe and posterior parietal lobe. Findings are compatible with acute infarctions. Old infarcts noted in the right frontotemporal lobe with associated parenchymal calcifications and in the left posterior frontal lobe. No hydrocephalus or hemorrhage. Vascular: No evidence of aneurysm or adenopathy. Skull: No acute calvarial abnormality. Sinuses/Orbits: Visualized paranasal sinuses and mastoids clear. Orbital soft tissues unremarkable. Other: None IMPRESSION: New large acute infarcts involving much of the mid and posterior right cerebral hemisphere as well as the left occipital lobe. 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 No hemorrhage. Electronically Signed   By: Charlett Nose M.D.   On: 10/06/2017 08:32   Ct Angio Neck W Or Wo Contrast  Result Date: 10/21/2017 CLINICAL DATA:  Code stroke. 84 year old female found unresponsive with gaze deviation. Family denies knowledge of a previous stroke however, there is bilateral MCA territory ischemia which most resembles chronic encephalomalacia on the plain CT at 1028 hours  today. EXAM: CT ANGIOGRAPHY HEAD AND NECK CT PERFUSION BRAIN TECHNIQUE: Multidetector CT imaging of the head and neck was performed using the standard protocol during bolus administration of intravenous contrast. Multiplanar CT image reconstructions and MIPs were obtained to evaluate the vascular anatomy. Carotid stenosis measurements (when applicable) are obtained utilizing NASCET criteria, using the distal internal carotid diameter as the denominator. Multiphase CT imaging of the brain was performed following IV bolus contrast injection. Subsequent parametric perfusion maps were calculated using RAPID software. CONTRAST:  90mL ISOVUE-370 IOPAMIDOL (ISOVUE-370) INJECTION 76% COMPARISON:  Head CT without contrast 1028 hours today. FINDINGS: CT Brain Perfusion Findings: The CTP is significantly degraded by motion artifact despite. This resulted in automatic exclusion of multiple segments from the computer analysis, including a significant portion of the arterial input function (series 1401, image 9). CBF (<30%) Volume: 24 milliliters, predominantly in the posterior right hemisphere, but this could be unreliable due to motion. Perfusion (Tmax>6.0s) volume: Bilateral cerebellar and cerebral hemisphere abnormal T-max values up to 200 milliliters, some of this is likely artifactual. There is right greater than left MCA territory abnormal T-max. Infarction Location:Posterior right hemisphere. CTA NECK Skeleton: Advanced cervical spine degeneration. No acute osseous abnormality identified. Upper chest: Mild motion artifact. Negative upper lungs. No superior mediastinal lymphadenopathy. Other neck: Negative; no neck mass or lymphadenopathy. Aortic arch: 3 vessel arch configuration with mild  to moderate soft and calcified arch atherosclerosis. Right carotid system: Stenosis. No brachiocephalic or right CCA The right ICA is occluded origin at the distal bulb (series 6, image 94 and series 10, image 87) and remains occluded to  the skull base. Left carotid system: Left CCA and proximal left ICA atherosclerosis without significant stenosis. Vertebral arteries: No proximal right subclavian artery stenosis. Mild calcified plaque at the right vertebral artery origin resulting in mild stenosis. Dominant right vertebral artery is patent to the skull base without additional stenosis. No significant proximal left subclavian artery stenosis despite soft and calcified plaque. Soft and calcified plaque near the origin of the non dominant left vertebral artery. Mild associated stenosis. The small left vertebral artery remains patent to the skull base without additional stenosis. CTA HEAD Posterior circulation: The dominant right vertebral artery supplies the basilar. The non dominant left vertebral appears to functionally terminates in PICA. The right PICA origin is patent. No distal right vertebral artery or basilar artery stenosis. Patent basilar tip. Fetal type right PCA origin, the right posterior communicating artery appears partially thrombosed as seen on series 13, image 15. Despite this, the right PCA remains patent. The distal right PCA branches arm only mildly hypoenhancing compared to the left. Normal left PCA origin. Left PCA branches are within normal limits. Anterior circulation: Patent left ICA siphon with no stenosis. Patent left ICA terminus. Normal left MCA and ACA origins. Left MCA M1 segment, bifurcation, and left MCA branches are within normal limits. The right ICA siphon is occluded, including the anterior portion of the right posterior communicating artery which appears to constitute a fetal type PCA origin. The ACAs and anterior communicating arteries are patent, and probably supplying collateral flow to the right MCA origin. The bilateral ACA branches are within normal limits. The right MCA M1 is patent, but the right MCA bifurcation is partially occluded. Paucity of right MCA M2 and distal branch enhancement (series 13, image  10). Venous sinuses: Patent on the delayed images. Anatomic variants: Dominant right vertebral artery and fetal type right PCA origin. Delayed phase: The abnormal right greater than left MCA territory hypodensity appears stable since 1029 hours. No definite acute cytotoxic edema. No abnormal enhancement identified. Review of the MIP images confirms the above findings IMPRESSION: 1. Positive for large vessel occlusion of the right ICA, and also the right MCA bifurcation. - furthermore, there is partial occlusion of the right Pcomm which constitutes a fetal type right PCA origin. The right PCA remains patent. - there is collateral flow to the right M1 via the anterior communicating artery and ACAs. 2. Study discussed by telephone with Dr. Milon Dikes on 10/03/2017 at 1055 hours, and again at 11:17. The CT perfusion analysis is likely unreliable due to motion resulting in omission of some of the arterial input phase. We discussed that in light of the above CTA findings, I suspect the 24 mL core infarct volume in the right hemisphere may be fairly accurate, with the right occipital lobe involvement explained by the fetal right PCA findings above. Probably then, much of the remaining right hemisphere is at risk, aside from that affected by what I suspect is a chronic right operculum infarct. Dr. Wilford Corner will attempt a stat DWI only MRI scan for confirmation, with NIR intervention pending. 3. No superimposed hemodynamically significant stenosis in the left carotid or vertebrobasilar system. The right vertebral is dominant and supplies the basilar. Electronically Signed   By: Odessa Fleming M.D.   On: 10/15/2017 11:22  Mr Brain 20 Contrast  Result Date: 10/07/2017 CLINICAL DATA:  84 year old female is poorly responsive with right ICA, right MCA bifurcation, and right Pcomm/PCA origin large vessel occlusion on CTA. CT perfusion affected by motion artifact, and confounding chronic appearing right greater than left MCA  territory infarcts on presentation noncontrast head CT. EXAM: MRI HEAD WITHOUT CONTRAST LIMITED TECHNIQUE: Diffusion and FLAIR pulse sequences of the brain and surrounding structures were obtained without intravenous contrast. COMPARISON:  Head CT, CTA and CT perfusion earlier today. FINDINGS: Axial diffusion weighted imaging demonstrates confluent abnormal signal in the posterior right hemisphere corresponding to the right MCA and right MCA/PCA watershed territory. On ADC much of this appears restricted on diffusion. Superimposed chronic encephalomalacia at the right frontal operculum. Superimposed cortical and subcortical white matter restricted diffusion in the left PCA territory and left PCA/MCA watershed. There is a small area of cortical encephalomalacia at the left posterior sylvian fissure although there appears to be some curvilinear surrounding cortical restricted diffusion or susceptibility artifact. Mildly motion degraded axial FLAIR imaging demonstrates the chronic areas of encephalomalacia with little if any FLAIR hyperintensity corresponding to the restricted diffusion. There is no intracranial mass effect. No ventriculomegaly. Basilar cisterns remain patent. IMPRESSION: 1. Right MCA and MCA/PCA watershed territory restricted diffusion suggesting core infarct beyond that estimated on the CTP earlier today. This ischemia appears concordant with the CTA findings of right ICA, right MCA bifurcation and right Pcomm origin occlusion. 2. Additional contralateral Left PCA and MCA PCA watershed territory restricted diffusion. On review of the earlier CTA, there is a paucity of flow in the distal left P3 branches concordant with this finding, although the left P1, P2 segments and left PCA bifurcation appear normal. 3. This study discussed by telephone with Dr. Wilford Corner on beginning at 1127 hours. Electronically Signed   By: Odessa Fleming M.D.   On: 10/13/2017 11:51   Ct Cerebral Perfusion W Contrast  Result Date:  10/07/2017 CLINICAL DATA:  Code stroke. 84 year old female found unresponsive with gaze deviation. Family denies knowledge of a previous stroke however, there is bilateral MCA territory ischemia which most resembles chronic encephalomalacia on the plain CT at 1028 hours today. EXAM: CT ANGIOGRAPHY HEAD AND NECK CT PERFUSION BRAIN TECHNIQUE: Multidetector CT imaging of the head and neck was performed using the standard protocol during bolus administration of intravenous contrast. Multiplanar CT image reconstructions and MIPs were obtained to evaluate the vascular anatomy. Carotid stenosis measurements (when applicable) are obtained utilizing NASCET criteria, using the distal internal carotid diameter as the denominator. Multiphase CT imaging of the brain was performed following IV bolus contrast injection. Subsequent parametric perfusion maps were calculated using RAPID software. CONTRAST:  90mL ISOVUE-370 IOPAMIDOL (ISOVUE-370) INJECTION 76% COMPARISON:  Head CT without contrast 1028 hours today. FINDINGS: CT Brain Perfusion Findings: The CTP is significantly degraded by motion artifact despite. This resulted in automatic exclusion of multiple segments from the computer analysis, including a significant portion of the arterial input function (series 1401, image 9). CBF (<30%) Volume: 24 milliliters, predominantly in the posterior right hemisphere, but this could be unreliable due to motion. Perfusion (Tmax>6.0s) volume: Bilateral cerebellar and cerebral hemisphere abnormal T-max values up to 200 milliliters, some of this is likely artifactual. There is right greater than left MCA territory abnormal T-max. Infarction Location:Posterior right hemisphere. CTA NECK Skeleton: Advanced cervical spine degeneration. No acute osseous abnormality identified. Upper chest: Mild motion artifact. Negative upper lungs. No superior mediastinal lymphadenopathy. Other neck: Negative; no neck mass or lymphadenopathy. Aortic  arch: 3  vessel arch configuration with mild to moderate soft and calcified arch atherosclerosis. Right carotid system: Stenosis. No brachiocephalic or right CCA The right ICA is occluded origin at the distal bulb (series 6, image 94 and series 10, image 87) and remains occluded to the skull base. Left carotid system: Left CCA and proximal left ICA atherosclerosis without significant stenosis. Vertebral arteries: No proximal right subclavian artery stenosis. Mild calcified plaque at the right vertebral artery origin resulting in mild stenosis. Dominant right vertebral artery is patent to the skull base without additional stenosis. No significant proximal left subclavian artery stenosis despite soft and calcified plaque. Soft and calcified plaque near the origin of the non dominant left vertebral artery. Mild associated stenosis. The small left vertebral artery remains patent to the skull base without additional stenosis. CTA HEAD Posterior circulation: The dominant right vertebral artery supplies the basilar. The non dominant left vertebral appears to functionally terminates in PICA. The right PICA origin is patent. No distal right vertebral artery or basilar artery stenosis. Patent basilar tip. Fetal type right PCA origin, the right posterior communicating artery appears partially thrombosed as seen on series 13, image 15. Despite this, the right PCA remains patent. The distal right PCA branches arm only mildly hypoenhancing compared to the left. Normal left PCA origin. Left PCA branches are within normal limits. Anterior circulation: Patent left ICA siphon with no stenosis. Patent left ICA terminus. Normal left MCA and ACA origins. Left MCA M1 segment, bifurcation, and left MCA branches are within normal limits. The right ICA siphon is occluded, including the anterior portion of the right posterior communicating artery which appears to constitute a fetal type PCA origin. The ACAs and anterior communicating arteries are  patent, and probably supplying collateral flow to the right MCA origin. The bilateral ACA branches are within normal limits. The right MCA M1 is patent, but the right MCA bifurcation is partially occluded. Paucity of right MCA M2 and distal branch enhancement (series 13, image 10). Venous sinuses: Patent on the delayed images. Anatomic variants: Dominant right vertebral artery and fetal type right PCA origin. Delayed phase: The abnormal right greater than left MCA territory hypodensity appears stable since 1029 hours. No definite acute cytotoxic edema. No abnormal enhancement identified. Review of the MIP images confirms the above findings IMPRESSION: 1. Positive for large vessel occlusion of the right ICA, and also the right MCA bifurcation. - furthermore, there is partial occlusion of the right Pcomm which constitutes a fetal type right PCA origin. The right PCA remains patent. - there is collateral flow to the right M1 via the anterior communicating artery and ACAs. 2. Study discussed by telephone with Dr. Milon Dikes on 12-Oct-2017 at 1055 hours, and again at 11:17. The CT perfusion analysis is likely unreliable due to motion resulting in omission of some of the arterial input phase. We discussed that in light of the above CTA findings, I suspect the 24 mL core infarct volume in the right hemisphere may be fairly accurate, with the right occipital lobe involvement explained by the fetal right PCA findings above. Probably then, much of the remaining right hemisphere is at risk, aside from that affected by what I suspect is a chronic right operculum infarct. Dr. Wilford Corner will attempt a stat DWI only MRI scan for confirmation, with NIR intervention pending. 3. No superimposed hemodynamically significant stenosis in the left carotid or vertebrobasilar system. The right vertebral is dominant and supplies the basilar. Electronically Signed   By: Rexene Edison  Margo Aye M.D.   On: 07-Oct-2017 11:22   Portable Chest Xray  Result Date:  10/06/2017 CLINICAL DATA:  Stroke, ET tube EXAM: PORTABLE CHEST 1 VIEW COMPARISON:  10/07/17 FINDINGS: Endotracheal tube is 4 cm above the carina. NG tube enters the stomach. Cardiomegaly with vascular congestion. Bibasilar opacities have increased, likely atelectasis. Possible small layering effusions. IMPRESSION: Cardiomegaly with vascular congestion. Increasing bibasilar atelectasis.  Suspect small layering effusions. Electronically Signed   By: Charlett Nose M.D.   On: 10/06/2017 08:07   Dg Chest Port 1 View  Result Date: 10/07/17 CLINICAL DATA:  Intubation EXAM: PORTABLE CHEST 1 VIEW COMPARISON:  None. FINDINGS: Endotracheal tube is 6 cm above the carina. NG tube is in the stomach. Heart is borderline in size. Bilateral perihilar and lower lobe opacities could reflect early edema or infection. No effusions or pneumothorax. Degenerative changes in the shoulders and thoracic spine. Aortic atherosclerosis. IMPRESSION: Borderline heart size. Perihilar and lower lobe airspace opacities bilaterally could reflect edema or infection. Endotracheal tube 6 cm above the carina. Electronically Signed   By: Charlett Nose M.D.   On: 10/07/17 12:12   Ct Head Code Stroke Wo Contrast  Result Date: 10/07/2017 CLINICAL DATA:  Code stroke. 84 year old female found unresponsive with gaze deviation. Family denies knowledge of a previous stroke. EXAM: CT HEAD WITHOUT CONTRAST TECHNIQUE: Contiguous axial images were obtained from the base of the skull through the vertex without intravenous contrast. COMPARISON:  No prior head CT. FINDINGS: Brain: There is a 3-4 centimeter area of hypodensity in the right MCA territory centered at the insula and operculum which has a chronic appearance. Some of this demonstrates dystrophic calcification. There is no associated mass effect. There is a smaller area of cortical hypodensity along the posterior left sylvian fissure which also has a chronic appearance (sagittal image 43). No  associated mass effect. No definite acute cytotoxic edema identified. No intracranial hemorrhage or mass effect. No other cortical encephalomalacia identified. No ventriculomegaly. Patent basilar cisterns. Vascular: Calcified atherosclerosis at the skull base. No suspicious intracranial vascular hyperdensity. Skull: Intermittent motion artifact. No acute osseous abnormality identified. Sinuses/Orbits: Visualized paranasal sinuses and mastoids are well pneumatized. Other: Gaze appears to be midline. Postoperative changes to both globes. No acute scalp soft tissue findings. ASPECTS [facility] Stroke Program Early CT Score) Total score (0-10 with 10 being normal): 10, allowing for bilateral MCA territory chronic appearing encephalomalacia. IMPRESSION: 1. Prior right greater than left MCA territory ischemia which is favored to be chronic. No intracranial hemorrhage, intracranial mass effect, or definite acute cytotoxic edema. 2. This was discussed by telephone with Dr. Wilford Corner on 2017/10/07 at 1055 hours. Electronically Signed   By: Odessa Fleming M.D.   On: October 07, 2017 10:59    TTE - Normal LV systolic function with grade III diastolic dysfunction. No ASD. LA severely dilated. Moderate MR, mild-moderate TR. Trivial AR with calcified and thickened leaflets.  LE venous doppler  Right: Portions of this examination were limited- see technologist comments above. There is no evidence of deep vein thrombosis in the lower extremity. However, portions of this examination were limited- see technologist comments above. No cystic  structure found in the popliteal fossa. Left: Portions of this examination were limited- see technologist comments above. There is no evidence of deep vein thrombosis in the lower extremity. However, portions of this examination were limited- see technologist comments above. non vascularized  hetergeneous area noted in the popliteal fossa, etiology unknown.     HISTORY OF PRESENT ILLNESS Jackqulyn Livings  is an 84 y.o. female fortunately there is no history and the epic system.  Via care everywhere she is on enalapril likely has hypertension.  There is no history of seizure or stroke.   Per family around 0915 this morning she was normal with a left and upon returning home she was no answering the door.  When they finally entered the house they found her in the bathroom sitting on the toilet staring forward.  They moved her to the floor.  She was noted to have defecated on herself.  EMS brought the patient to the hospital.  CT scan showed a large area of hypodensity with calcification in the right parietal frontal lobe.  Initially in CT her left leg had increased tone however as time is gone on she is starting to move that left leg.  CTA was attempted but do to her moving it was a difficult image.it appears she has a right MCA occlusion. CT perfusion was also difficult due to her moving her head thus she was rushed to MRI to obtain a STAT perfusion.   Unclear clinical picture of seizure vs stroke initially - precluded tPA as well as the fact that family said that she was very 'anxious' as soon as she woke up this morning putting the LKN at last night.  Date last known well: Date: 2017/04/06 Time last known well: initally 0915 but later by family possibly when she went to bed last night 10pm 10/04/2016 tPA Given: No: ?sz v stroke initially, later LKN outside window  Modified Rankin: 1   HOSPITAL COURSE Ms. Jackqulyn LivingsMarion Balan is a 84 y.o. female with history of HTN, anxiety, breast cancer survivor admitted for AMS and left sided weakness. No tPA given due to OSW and possible seizure activity. Underwent IR with TICI2b revascularization.     Stroke:  bilateral infarcts with large right MCA and moderate left MCA and PCA infarcts, embolic pattern, source unclear  Resultant intubated, left hemiplegia  MRI  bilateral infarcts with large right MCA and moderate left MCA and PCA infarcts, old bilateral  frontal infarcts, R larger than L  CTA head and neck - right ICA and MCA occlusion, right PCOM occlusion  DSA - RT ICA cavernous to terminus occlusion , and of the dominant RT PCOM supplying both PCAs - s/p TICI2b reperfusion  Repeat CT showed large right MCA infarcts and left occipital infarcts  MRI and MRA cancelled as request by family  2D Echo unremarkable  LE venous doppler no DVT  LDL 85  HgbA1c 5.0  NPO   Family requested comfort care measures - expired at 14:45  Right ICA and MCA occlusion  Etiology unclear  CTA head and neck - right ICA and MCA occlusion, right PCOM occlusion  DSA - RT ICA cavernous to terminus occlusion , and of the dominant RT PCOM supplying both PCAs - s/p TICI2b reperfusion  Hyperlipidemia  Home meds:  none   LDL 85, goal < 70  Other Stroke Risk Factors  Advanced age  Hx stroke/TIA - on images  Other Active Problems  Anxiety  Breast cancer survivor      DISCHARGE EXAM Pt deceased.   Marvel PlanJindong Obie Kallenbach, MD PhD Stroke Neurology 10/01/2017 3:48 PM

## 2017-10-22 NOTE — Consult Note (Signed)
Consultation Note Date: October 10, 2017   Patient Name: Kathryn Johnson  DOB: 21-Jun-1930  MRN: [id number]  Age / Sex: 84 y.o., female  PCP: ViaCaryn Bee, MD Referring Physician: Marvel Plan, MD  Reason for Consultation: Establishing goals of care  HPI/Patient Profile: 84 y.o. female  with past medical history of HTN, anxiety, breast cancer,  admitted on 10/18/2017 with althered mental status and L sided weakness. Workup revealed bilateral infarcts with L R and mod L MCA and PCA emboli, requiring intubation. She has been compassionately intubated and transitioned to comfort care by critical care and neurology team. Palliative medicine consulted for end of life care.   Clinical Assessment and Goals of Care:  Patient in bed and actively dying. Breathing irregular, with increased WOB and use of accessory muscles at times. She has poor coloring, and her peripheral pulses are weak. I provided mouth care to which she did not react at all.   Discussed with her daughterBonita Quin. [name] is concerned about her mother dying during transport to residential hospice facility and I agree this is a valid concern. [name] expressed relief that her mother would not be transferred. She also was very grateful for all care her mother had received during her admission.   Primary Decision Maker NEXT OF KIN- daughter- [name]   SUMMARY OF RECOMMENDATIONS   -Patient actively dying- not stable for transport -Cancel request for residential Hospice -Continue scheduled lorazepam and morphine infusion -Add morphine bolus 4mg  q30 min PRN for increased WOB or other signs of discomfort -Add lorazepam 2mg  q4hr prn for SOB not responsive to morphine boluses -PMT will continue to follow and assist with end of life care  Code Status/Advance Care Planning:  DNR  Palliative Prophylaxis:   Frequent Pain Assessment, Turn Reposition and frequent  assessment for SOB  Additional Recommendations (Limitations, Scope, Preferences):  Full Comfort Care  Prognosis:    Hours - Days  Discharge Planning: Anticipated Hospital Death  Primary Diagnoses: Present on Admission: . Acute ischemic stroke (HCC) . Middle cerebral artery embolism, right   I have reviewed the medical record, interviewed the patient and family, and examined the patient. The following aspects are pertinent.  History reviewed. No pertinent past medical history. Social History   Socioeconomic History  . Marital status: Married    Spouse name: Not on file  . Number of children: Not on file  . Years of education: Not on file  . Highest education level: Not on file  Occupational History  . Not on file  Social Needs  . Financial resource strain: Not on file  . Food insecurity:    Worry: Not on file    Inability: Not on file  . Transportation needs:    Medical: Not on file    Non-medical: Not on file  Tobacco Use  . Smoking status: Not on file  Substance and Sexual Activity  . Alcohol use: Not on file  . Drug use: Not on file  . Sexual activity: Not on file  Lifestyle  . Physical  activity:    Days per week: Not on file    Minutes per session: Not on file  . Stress: Not on file  Relationships  . Social connections:    Talks on phone: Not on file    Gets together: Not on file    Attends religious service: Not on file    Active member of club or organization: Not on file    Attends meetings of clubs or organizations: Not on file    Relationship status: Not on file  Other Topics Concern  . Not on file  Social History Narrative  . Not on file   Family History  Problem Relation Age of Onset  . Aneurysm Mother   . Heart disease Brother    Scheduled Meds: . LORazepam  1 mg Intravenous Q6H  . scopolamine  1 patch Transdermal Q72H   Continuous Infusions: . morphine 4 mg/hr (09/25/2017 0812)   PRN Meds:.glycopyrrolate, morphine Medications Prior  to Admission:  Prior to Admission medications   Medication Sig Start Date End Date Taking? Authorizing Provider  amLODipine (NORVASC) 5 MG tablet Take 7.5 mg by mouth daily. 07/30/17  Yes [provider]  aspirin EC 81 MG tablet Take 81 mg by mouth daily.   Yes [provider]  AZO-CRANBERRY PO Take 1 tablet by mouth daily.   Yes [provider]  calcium carbonate (OSCAL) 1500 (600 Ca) MG TABS tablet Take 1,500 mg by mouth 2 (two) times daily with a meal.   Yes [provider]  Cholecalciferol (VITAMIN D PO) Take 1 tablet by mouth daily.   Yes [provider]  enalapril (VASOTEC) 20 MG tablet Take 20 mg by mouth 2 (two) times daily. 09/29/17  Yes [provider]  folic acid (FOLVITE) 400 MCG tablet Take 400 mcg by mouth daily.   Yes [provider]  hydrochlorothiazide (HYDRODIURIL) 25 MG tablet Take 25 mg by mouth every morning. 09/19/17  Yes [provider]  LORazepam (ATIVAN) 0.5 MG tablet Take 0.25 mg by mouth every 8 (eight) hours as needed for anxiety.   Yes [provider]  metoprolol succinate (TOPROL-XL) 100 MG 24 hr tablet Take 100 mg by mouth daily. 08/18/17  Yes [provider]  metoprolol succinate (TOPROL-XL) 25 MG 24 hr tablet Take 25 mg by mouth daily. 08/18/17  Yes [provider]  Multiple Vitamins-Minerals (PROTEGRA PO) Take 1-2 capsules by mouth daily.   Yes [provider]  Probiotic Product (PROBIOTIC PO) Take 1 capsule by mouth daily.   Yes [provider]  psyllium (HYDROCIL/METAMUCIL) 95 % PACK Take 1 packet by mouth daily.   Yes [provider]  pyridOXINE (VITAMIN B-6) 50 MG tablet Take 50 mg by mouth daily.   Yes [provider]   No Known Allergies Review of Systems  Physical Exam  Vital Signs: BP 98/76 (BP Location: Left Arm)   Pulse (!) 113   Temp 99.5 F (37.5 C) (Axillary)   Resp 18   Ht 5\' 4"  (1.626 m)   Wt 75 kg (165 lb 5.5  oz)   SpO2 91%   BMI 28.38 kg/m  Pain Scale: 0-10 POSS *See Group Information*: 1-Acceptable,Awake and alert Pain Score: 0-No pain   SpO2: SpO2: 91 % O2 Device:SpO2: 91 % O2 Flow Rate: .O2 Flow Rate (L/min): 4 L/min  IO: Intake/output summary:   Intake/Output Summary (Last 24 hours) at 09/25/2017 1403 Last data filed at 10/16/2017 0750 Gross per 24 hour  Intake 60  ml  Output 650 ml  Net -590 ml    LBM: Last BM Date: Oct 14, 2017 Baseline Weight: Weight: 73.6 kg (162 lb 4.1 oz) Most recent weight: Weight: 75 kg (165 lb 5.5 oz)     Palliative Assessment/Data: PPS: 10%     Thank you for this consult. Palliative medicine will continue to follow and assist as needed.   Time In: 1300 Time Out: 1415 Time Total: 75 minutes Greater than 50%  of this time was spent counseling and coordinating care related to the above assessment and plan.  Signed by: Ocie BobKasie Andreal Vultaggio, AGNP-C Palliative Medicine    Please contact Palliative Medicine Team phone at [phone] for questions and concerns.  For individual provider: See Loretha StaplerAmion

## 2017-10-22 NOTE — Progress Notes (Signed)
Morphine infusion 100mg /15500ml , approx 90 cc wasted in sink. Witnessed by Shanon BrowJoyce Whitaker, RN

## 2017-10-22 NOTE — Progress Notes (Signed)
Patient unresponsive, Vitals signs unrecordable.No spontaneous respiration, no pulse  Charge Nurse,Joan Nehemiah SettleKiang was notified and pronounced patient expired at this time.Daughter Bonita QuinLinda and Dr. Roda ShuttersXU, [place] were notified. [place] donor notified. Post mortem care done.

## 2017-10-22 NOTE — Progress Notes (Signed)
STROKE TEAM PROGRESS NOTE   SUBJECTIVE (INTERVAL HISTORY) Her  RN is at the bedside.  Pt has terminal extubation yesterday and now in comfort care with morphine drip. This am looks comfort and no sign of pain. Breathing evenly and spontaneously. Discussed with daughter and son in law and social worker over the phone, will start prepare for residential hospice.    OBJECTIVE Temp:  [97.1 F (36.2 C)-99.5 F (37.5 C)] 99.5 F (37.5 C) (07/18 0532) Pulse Rate:  [70-113] 113 (07/18 0532) Resp:  [12-22] 18 (07/18 0532) BP: (98-157)/(69-76) 98/76 (07/18 0532) SpO2:  [87 %-100 %] 91 % (07/17 1830)  Recent Labs  Lab 10/06/17 1940 10/06/17 2320 10/07/17 0323 10/07/17 0814 10/07/17 1152  GLUCAP 97 108* 110* 72 98   Recent Labs  Lab 09/30/2017 1011 10/04/2017 1017 09/27/2017 1711 10/06/17 0315 10/07/17 0405  NA 129* 128* 131* 134* 136  K 3.0* 3.0* 2.4* 3.1* 3.2*  CL 91* 91* 99 103 108  CO2 25  --  23 23 21*  GLUCOSE 125* 125* 113* 101* 100*  BUN 15 16 11 10 8   CREATININE 0.64 0.50 0.53 0.57 0.52  CALCIUM 9.5  --  7.0* 7.4* 7.7*  MG  --   --  1.3*  --   --   PHOS  --   --  2.8 2.3*  --    Recent Labs  Lab 10/20/2017 1011 10/09/2017 1711 10/06/17 0315  AST 23  --   --   ALT 19  --   --   ALKPHOS 62  --   --   BILITOT 1.7*  --   --   PROT 7.6  --   --   ALBUMIN 4.2 2.8* 2.8*   Recent Labs  Lab 10/04/2017 1011 10/15/2017 1017 10/06/17 0315 10/07/17 0405  WBC 14.5*  --  13.4* 15.2*  NEUTROABS 12.2*  --  11.2* 12.8*  HGB 12.9 15.3* 9.3* 9.2*  HCT 39.2 45.0 29.2* 28.3*  MCV 67.4*  --  67.0* 67.5*  PLT 365  --  148* 140*   No results for input(s): CKTOTAL, CKMB, CKMBINDEX, TROPONINI in the last 168 hours. No results for input(s): LABPROT, INR in the last 72 hours. No results for input(s): COLORURINE, LABSPEC, PHURINE, GLUCOSEU, HGBUR, BILIRUBINUR, KETONESUR, PROTEINUR, UROBILINOGEN, NITRITE, LEUKOCYTESUR in the last 72 hours.  Invalid input(s): APPERANCEUR     Component Value  Date/Time   CHOL 142 10/06/2017 0315   TRIG 149 10/06/2017 0315   HDL 27 (L) 10/06/2017 0315   CHOLHDL 5.3 10/06/2017 0315   VLDL 30 10/06/2017 0315   LDLCALC 85 10/06/2017 0315   Lab Results  Component Value Date   HGBA1C 5.0 10/06/2017   No results found for: LABOPIA, COCAINSCRNUR, LABBENZ, AMPHETMU, THCU, LABBARB  Recent Labs  Lab 10/02/2017 1011  ETH <10    I have personally reviewed the radiological images below and agree with the radiology interpretations.  Ct Angio Head W Or Wo Contrast  Result Date: 10/18/2017 CLINICAL DATA:  Code stroke. 84 year old female found unresponsive with gaze deviation. Family denies knowledge of a previous stroke however, there is bilateral MCA territory ischemia which most resembles chronic encephalomalacia on the plain CT at 1028 hours today. EXAM: CT ANGIOGRAPHY HEAD AND NECK CT PERFUSION BRAIN TECHNIQUE: Multidetector CT imaging of the head and neck was performed using the standard protocol during bolus administration of intravenous contrast. Multiplanar CT image reconstructions and MIPs were obtained to evaluate the vascular anatomy. Carotid stenosis measurements (when applicable)  are obtained utilizing NASCET criteria, using the distal internal carotid diameter as the denominator. Multiphase CT imaging of the brain was performed following IV bolus contrast injection. Subsequent parametric perfusion maps were calculated using RAPID software. CONTRAST:  90mL ISOVUE-370 IOPAMIDOL (ISOVUE-370) INJECTION 76% COMPARISON:  Head CT without contrast 1028 hours today. FINDINGS: CT Brain Perfusion Findings: The CTP is significantly degraded by motion artifact despite. This resulted in automatic exclusion of multiple segments from the computer analysis, including a significant portion of the arterial input function (series 1401, image 9). CBF (<30%) Volume: 24 milliliters, predominantly in the posterior right hemisphere, but this could be unreliable due to motion.  Perfusion (Tmax>6.0s) volume: Bilateral cerebellar and cerebral hemisphere abnormal T-max values up to 200 milliliters, some of this is likely artifactual. There is right greater than left MCA territory abnormal T-max. Infarction Location:Posterior right hemisphere. CTA NECK Skeleton: Advanced cervical spine degeneration. No acute osseous abnormality identified. Upper chest: Mild motion artifact. Negative upper lungs. No superior mediastinal lymphadenopathy. Other neck: Negative; no neck mass or lymphadenopathy. Aortic arch: 3 vessel arch configuration with mild to moderate soft and calcified arch atherosclerosis. Right carotid system: Stenosis. No brachiocephalic or right CCA The right ICA is occluded origin at the distal bulb (series 6, image 94 and series 10, image 87) and remains occluded to the skull base. Left carotid system: Left CCA and proximal left ICA atherosclerosis without significant stenosis. Vertebral arteries: No proximal right subclavian artery stenosis. Mild calcified plaque at the right vertebral artery origin resulting in mild stenosis. Dominant right vertebral artery is patent to the skull base without additional stenosis. No significant proximal left subclavian artery stenosis despite soft and calcified plaque. Soft and calcified plaque near the origin of the non dominant left vertebral artery. Mild associated stenosis. The small left vertebral artery remains patent to the skull base without additional stenosis. CTA HEAD Posterior circulation: The dominant right vertebral artery supplies the basilar. The non dominant left vertebral appears to functionally terminates in PICA. The right PICA origin is patent. No distal right vertebral artery or basilar artery stenosis. Patent basilar tip. Fetal type right PCA origin, the right posterior communicating artery appears partially thrombosed as seen on series 13, image 15. Despite this, the right PCA remains patent. The distal right PCA branches arm  only mildly hypoenhancing compared to the left. Normal left PCA origin. Left PCA branches are within normal limits. Anterior circulation: Patent left ICA siphon with no stenosis. Patent left ICA terminus. Normal left MCA and ACA origins. Left MCA M1 segment, bifurcation, and left MCA branches are within normal limits. The right ICA siphon is occluded, including the anterior portion of the right posterior communicating artery which appears to constitute a fetal type PCA origin. The ACAs and anterior communicating arteries are patent, and probably supplying collateral flow to the right MCA origin. The bilateral ACA branches are within normal limits. The right MCA M1 is patent, but the right MCA bifurcation is partially occluded. Paucity of right MCA M2 and distal branch enhancement (series 13, image 10). Venous sinuses: Patent on the delayed images. Anatomic variants: Dominant right vertebral artery and fetal type right PCA origin. Delayed phase: The abnormal right greater than left MCA territory hypodensity appears stable since 1029 hours. No definite acute cytotoxic edema. No abnormal enhancement identified. Review of the MIP images confirms the above findings IMPRESSION: 1. Positive for large vessel occlusion of the right ICA, and also the right MCA bifurcation. - furthermore, there is partial occlusion of the  right Pcomm which constitutes a fetal type right PCA origin. The right PCA remains patent. - there is collateral flow to the right M1 via the anterior communicating artery and ACAs. 2. Study discussed by telephone with Dr. Milon Dikes on 10/10/2017 at 1055 hours, and again at 11:17. The CT perfusion analysis is likely unreliable due to motion resulting in omission of some of the arterial input phase. We discussed that in light of the above CTA findings, I suspect the 24 mL core infarct volume in the right hemisphere may be fairly accurate, with the right occipital lobe involvement explained by the fetal right  PCA findings above. Probably then, much of the remaining right hemisphere is at risk, aside from that affected by what I suspect is a chronic right operculum infarct. Dr. Wilford Corner will attempt a stat DWI only MRI scan for confirmation, with NIR intervention pending. 3. No superimposed hemodynamically significant stenosis in the left carotid or vertebrobasilar system. The right vertebral is dominant and supplies the basilar. Electronically Signed   By: Odessa Fleming M.D.   On: 09/21/2017 11:22   Ct Head Wo Contrast  Result Date: 10/06/2017 CLINICAL DATA:  Altered level of consciousness EXAM: CT HEAD WITHOUT CONTRAST TECHNIQUE: Contiguous axial images were obtained from the base of the skull through the vertex without intravenous contrast. COMPARISON:  10/19/2017 FINDINGS: Brain: Motion artifact degrades image quality. There is new diffuse low-density noted throughout much of the right cerebral hemisphere, most notable in the temporal, parietal and occipital lobes. New low-density also noted posteriorly in the left occipital lobe and posterior parietal lobe. Findings are compatible with acute infarctions. Old infarcts noted in the right frontotemporal lobe with associated parenchymal calcifications and in the left posterior frontal lobe. No hydrocephalus or hemorrhage. Vascular: No evidence of aneurysm or adenopathy. Skull: No acute calvarial abnormality. Sinuses/Orbits: Visualized paranasal sinuses and mastoids clear. Orbital soft tissues unremarkable. Other: None IMPRESSION: New large acute infarcts involving much of the mid and posterior right cerebral hemisphere as well as the left occipital lobe. 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 No hemorrhage. Electronically Signed   By: Charlett Nose M.D.   On: 10/06/2017 08:32   Ct Angio Neck W Or Wo Contrast  Result Date: 10/07/2017 CLINICAL DATA:  Code stroke. 84 year old female found unresponsive with gaze deviation. Family denies knowledge of a previous  stroke however, there is bilateral MCA territory ischemia which most resembles chronic encephalomalacia on the plain CT at 1028 hours today. EXAM: CT ANGIOGRAPHY HEAD AND NECK CT PERFUSION BRAIN TECHNIQUE: Multidetector CT imaging of the head and neck was performed using the standard protocol during bolus administration of intravenous contrast. Multiplanar CT image reconstructions and MIPs were obtained to evaluate the vascular anatomy. Carotid stenosis measurements (when applicable) are obtained utilizing NASCET criteria, using the distal internal carotid diameter as the denominator. Multiphase CT imaging of the brain was performed following IV bolus contrast injection. Subsequent parametric perfusion maps were calculated using RAPID software. CONTRAST:  90mL ISOVUE-370 IOPAMIDOL (ISOVUE-370) INJECTION 76% COMPARISON:  Head CT without contrast 1028 hours today. FINDINGS: CT Brain Perfusion Findings: The CTP is significantly degraded by motion artifact despite. This resulted in automatic exclusion of multiple segments from the computer analysis, including a significant portion of the arterial input function (series 1401, image 9). CBF (<30%) Volume: 24 milliliters, predominantly in the posterior right hemisphere, but this could be unreliable due to motion. Perfusion (Tmax>6.0s) volume: Bilateral cerebellar and cerebral hemisphere abnormal T-max values up to 200 milliliters,  some of this is likely artifactual. There is right greater than left MCA territory abnormal T-max. Infarction Location:Posterior right hemisphere. CTA NECK Skeleton: Advanced cervical spine degeneration. No acute osseous abnormality identified. Upper chest: Mild motion artifact. Negative upper lungs. No superior mediastinal lymphadenopathy. Other neck: Negative; no neck mass or lymphadenopathy. Aortic arch: 3 vessel arch configuration with mild to moderate soft and calcified arch atherosclerosis. Right carotid system: Stenosis. No brachiocephalic  or right CCA The right ICA is occluded origin at the distal bulb (series 6, image 94 and series 10, image 87) and remains occluded to the skull base. Left carotid system: Left CCA and proximal left ICA atherosclerosis without significant stenosis. Vertebral arteries: No proximal right subclavian artery stenosis. Mild calcified plaque at the right vertebral artery origin resulting in mild stenosis. Dominant right vertebral artery is patent to the skull base without additional stenosis. No significant proximal left subclavian artery stenosis despite soft and calcified plaque. Soft and calcified plaque near the origin of the non dominant left vertebral artery. Mild associated stenosis. The small left vertebral artery remains patent to the skull base without additional stenosis. CTA HEAD Posterior circulation: The dominant right vertebral artery supplies the basilar. The non dominant left vertebral appears to functionally terminates in PICA. The right PICA origin is patent. No distal right vertebral artery or basilar artery stenosis. Patent basilar tip. Fetal type right PCA origin, the right posterior communicating artery appears partially thrombosed as seen on series 13, image 15. Despite this, the right PCA remains patent. The distal right PCA branches arm only mildly hypoenhancing compared to the left. Normal left PCA origin. Left PCA branches are within normal limits. Anterior circulation: Patent left ICA siphon with no stenosis. Patent left ICA terminus. Normal left MCA and ACA origins. Left MCA M1 segment, bifurcation, and left MCA branches are within normal limits. The right ICA siphon is occluded, including the anterior portion of the right posterior communicating artery which appears to constitute a fetal type PCA origin. The ACAs and anterior communicating arteries are patent, and probably supplying collateral flow to the right MCA origin. The bilateral ACA branches are within normal limits. The right MCA M1  is patent, but the right MCA bifurcation is partially occluded. Paucity of right MCA M2 and distal branch enhancement (series 13, image 10). Venous sinuses: Patent on the delayed images. Anatomic variants: Dominant right vertebral artery and fetal type right PCA origin. Delayed phase: The abnormal right greater than left MCA territory hypodensity appears stable since 1029 hours. No definite acute cytotoxic edema. No abnormal enhancement identified. Review of the MIP images confirms the above findings IMPRESSION: 1. Positive for large vessel occlusion of the right ICA, and also the right MCA bifurcation. - furthermore, there is partial occlusion of the right Pcomm which constitutes a fetal type right PCA origin. The right PCA remains patent. - there is collateral flow to the right M1 via the anterior communicating artery and ACAs. 2. Study discussed by telephone with Dr. Milon Dikes on 11-04-2017 at 1055 hours, and again at 11:17. The CT perfusion analysis is likely unreliable due to motion resulting in omission of some of the arterial input phase. We discussed that in light of the above CTA findings, I suspect the 24 mL core infarct volume in the right hemisphere may be fairly accurate, with the right occipital lobe involvement explained by the fetal right PCA findings above. Probably then, much of the remaining right hemisphere is at risk, aside from that affected by what  I suspect is a chronic right operculum infarct. Dr. Wilford Corner will attempt a stat DWI only MRI scan for confirmation, with NIR intervention pending. 3. No superimposed hemodynamically significant stenosis in the left carotid or vertebrobasilar system. The right vertebral is dominant and supplies the basilar. Electronically Signed   By: Odessa Fleming M.D.   On: 10/04/2017 11:22   Mr Brain Wo Contrast  Result Date: 10/07/2017 CLINICAL DATA:  84 year old female is poorly responsive with right ICA, right MCA bifurcation, and right Pcomm/PCA origin large  vessel occlusion on CTA. CT perfusion affected by motion artifact, and confounding chronic appearing right greater than left MCA territory infarcts on presentation noncontrast head CT. EXAM: MRI HEAD WITHOUT CONTRAST LIMITED TECHNIQUE: Diffusion and FLAIR pulse sequences of the brain and surrounding structures were obtained without intravenous contrast. COMPARISON:  Head CT, CTA and CT perfusion earlier today. FINDINGS: Axial diffusion weighted imaging demonstrates confluent abnormal signal in the posterior right hemisphere corresponding to the right MCA and right MCA/PCA watershed territory. On ADC much of this appears restricted on diffusion. Superimposed chronic encephalomalacia at the right frontal operculum. Superimposed cortical and subcortical white matter restricted diffusion in the left PCA territory and left PCA/MCA watershed. There is a small area of cortical encephalomalacia at the left posterior sylvian fissure although there appears to be some curvilinear surrounding cortical restricted diffusion or susceptibility artifact. Mildly motion degraded axial FLAIR imaging demonstrates the chronic areas of encephalomalacia with little if any FLAIR hyperintensity corresponding to the restricted diffusion. There is no intracranial mass effect. No ventriculomegaly. Basilar cisterns remain patent. IMPRESSION: 1. Right MCA and MCA/PCA watershed territory restricted diffusion suggesting core infarct beyond that estimated on the CTP earlier today. This ischemia appears concordant with the CTA findings of right ICA, right MCA bifurcation and right Pcomm origin occlusion. 2. Additional contralateral Left PCA and MCA PCA watershed territory restricted diffusion. On review of the earlier CTA, there is a paucity of flow in the distal left P3 branches concordant with this finding, although the left P1, P2 segments and left PCA bifurcation appear normal. 3. This study discussed by telephone with Dr. Wilford Corner on beginning at  1127 hours. Electronically Signed   By: Odessa Fleming M.D.   On: 09/24/2017 11:51   Ct Cerebral Perfusion W Contrast  Result Date: 09/24/2017 CLINICAL DATA:  Code stroke. 84 year old female found unresponsive with gaze deviation. Family denies knowledge of a previous stroke however, there is bilateral MCA territory ischemia which most resembles chronic encephalomalacia on the plain CT at 1028 hours today. EXAM: CT ANGIOGRAPHY HEAD AND NECK CT PERFUSION BRAIN TECHNIQUE: Multidetector CT imaging of the head and neck was performed using the standard protocol during bolus administration of intravenous contrast. Multiplanar CT image reconstructions and MIPs were obtained to evaluate the vascular anatomy. Carotid stenosis measurements (when applicable) are obtained utilizing NASCET criteria, using the distal internal carotid diameter as the denominator. Multiphase CT imaging of the brain was performed following IV bolus contrast injection. Subsequent parametric perfusion maps were calculated using RAPID software. CONTRAST:  90mL ISOVUE-370 IOPAMIDOL (ISOVUE-370) INJECTION 76% COMPARISON:  Head CT without contrast 1028 hours today. FINDINGS: CT Brain Perfusion Findings: The CTP is significantly degraded by motion artifact despite. This resulted in automatic exclusion of multiple segments from the computer analysis, including a significant portion of the arterial input function (series 1401, image 9). CBF (<30%) Volume: 24 milliliters, predominantly in the posterior right hemisphere, but this could be unreliable due to motion. Perfusion (Tmax>6.0s) volume: Bilateral cerebellar and  cerebral hemisphere abnormal T-max values up to 200 milliliters, some of this is likely artifactual. There is right greater than left MCA territory abnormal T-max. Infarction Location:Posterior right hemisphere. CTA NECK Skeleton: Advanced cervical spine degeneration. No acute osseous abnormality identified. Upper chest: Mild motion artifact.  Negative upper lungs. No superior mediastinal lymphadenopathy. Other neck: Negative; no neck mass or lymphadenopathy. Aortic arch: 3 vessel arch configuration with mild to moderate soft and calcified arch atherosclerosis. Right carotid system: Stenosis. No brachiocephalic or right CCA The right ICA is occluded origin at the distal bulb (series 6, image 94 and series 10, image 87) and remains occluded to the skull base. Left carotid system: Left CCA and proximal left ICA atherosclerosis without significant stenosis. Vertebral arteries: No proximal right subclavian artery stenosis. Mild calcified plaque at the right vertebral artery origin resulting in mild stenosis. Dominant right vertebral artery is patent to the skull base without additional stenosis. No significant proximal left subclavian artery stenosis despite soft and calcified plaque. Soft and calcified plaque near the origin of the non dominant left vertebral artery. Mild associated stenosis. The small left vertebral artery remains patent to the skull base without additional stenosis. CTA HEAD Posterior circulation: The dominant right vertebral artery supplies the basilar. The non dominant left vertebral appears to functionally terminates in PICA. The right PICA origin is patent. No distal right vertebral artery or basilar artery stenosis. Patent basilar tip. Fetal type right PCA origin, the right posterior communicating artery appears partially thrombosed as seen on series 13, image 15. Despite this, the right PCA remains patent. The distal right PCA branches arm only mildly hypoenhancing compared to the left. Normal left PCA origin. Left PCA branches are within normal limits. Anterior circulation: Patent left ICA siphon with no stenosis. Patent left ICA terminus. Normal left MCA and ACA origins. Left MCA M1 segment, bifurcation, and left MCA branches are within normal limits. The right ICA siphon is occluded, including the anterior portion of the right  posterior communicating artery which appears to constitute a fetal type PCA origin. The ACAs and anterior communicating arteries are patent, and probably supplying collateral flow to the right MCA origin. The bilateral ACA branches are within normal limits. The right MCA M1 is patent, but the right MCA bifurcation is partially occluded. Paucity of right MCA M2 and distal branch enhancement (series 13, image 10). Venous sinuses: Patent on the delayed images. Anatomic variants: Dominant right vertebral artery and fetal type right PCA origin. Delayed phase: The abnormal right greater than left MCA territory hypodensity appears stable since 1029 hours. No definite acute cytotoxic edema. No abnormal enhancement identified. Review of the MIP images confirms the above findings IMPRESSION: 1. Positive for large vessel occlusion of the right ICA, and also the right MCA bifurcation. - furthermore, there is partial occlusion of the right Pcomm which constitutes a fetal type right PCA origin. The right PCA remains patent. - there is collateral flow to the right M1 via the anterior communicating artery and ACAs. 2. Study discussed by telephone with Dr. Milon DikesASHISH ARORA on 09/21/2017 at 1055 hours, and again at 11:17. The CT perfusion analysis is likely unreliable due to motion resulting in omission of some of the arterial input phase. We discussed that in light of the above CTA findings, I suspect the 24 mL core infarct volume in the right hemisphere may be fairly accurate, with the right occipital lobe involvement explained by the fetal right PCA findings above. Probably then, much of the remaining right hemisphere  is at risk, aside from that affected by what I suspect is a chronic right operculum infarct. Dr. Wilford Corner will attempt a stat DWI only MRI scan for confirmation, with NIR intervention pending. 3. No superimposed hemodynamically significant stenosis in the left carotid or vertebrobasilar system. The right vertebral is  dominant and supplies the basilar. Electronically Signed   By: Odessa Fleming M.D.   On: 2017-10-13 11:22   Portable Chest Xray  Result Date: 10/06/2017 CLINICAL DATA:  Stroke, ET tube EXAM: PORTABLE CHEST 1 VIEW COMPARISON:  10/13/17 FINDINGS: Endotracheal tube is 4 cm above the carina. NG tube enters the stomach. Cardiomegaly with vascular congestion. Bibasilar opacities have increased, likely atelectasis. Possible small layering effusions. IMPRESSION: Cardiomegaly with vascular congestion. Increasing bibasilar atelectasis.  Suspect small layering effusions. Electronically Signed   By: Charlett Nose M.D.   On: 10/06/2017 08:07   Dg Chest Port 1 View  Result Date: 10-13-2017 CLINICAL DATA:  Intubation EXAM: PORTABLE CHEST 1 VIEW COMPARISON:  None. FINDINGS: Endotracheal tube is 6 cm above the carina. NG tube is in the stomach. Heart is borderline in size. Bilateral perihilar and lower lobe opacities could reflect early edema or infection. No effusions or pneumothorax. Degenerative changes in the shoulders and thoracic spine. Aortic atherosclerosis. IMPRESSION: Borderline heart size. Perihilar and lower lobe airspace opacities bilaterally could reflect edema or infection. Endotracheal tube 6 cm above the carina. Electronically Signed   By: Charlett Nose M.D.   On: 2017-10-13 12:12   Ct Head Code Stroke Wo Contrast  Result Date: 10/13/17 CLINICAL DATA:  Code stroke. 84 year old female found unresponsive with gaze deviation. Family denies knowledge of a previous stroke. EXAM: CT HEAD WITHOUT CONTRAST TECHNIQUE: Contiguous axial images were obtained from the base of the skull through the vertex without intravenous contrast. COMPARISON:  No prior head CT. FINDINGS: Brain: There is a 3-4 centimeter area of hypodensity in the right MCA territory centered at the insula and operculum which has a chronic appearance. Some of this demonstrates dystrophic calcification. There is no associated mass effect. There is a  smaller area of cortical hypodensity along the posterior left sylvian fissure which also has a chronic appearance (sagittal image 43). No associated mass effect. No definite acute cytotoxic edema identified. No intracranial hemorrhage or mass effect. No other cortical encephalomalacia identified. No ventriculomegaly. Patent basilar cisterns. Vascular: Calcified atherosclerosis at the skull base. No suspicious intracranial vascular hyperdensity. Skull: Intermittent motion artifact. No acute osseous abnormality identified. Sinuses/Orbits: Visualized paranasal sinuses and mastoids are well pneumatized. Other: Gaze appears to be midline. Postoperative changes to both globes. No acute scalp soft tissue findings. ASPECTS [facility] Stroke Program Early CT Score) Total score (0-10 with 10 being normal): 10, allowing for bilateral MCA territory chronic appearing encephalomalacia. IMPRESSION: 1. Prior right greater than left MCA territory ischemia which is favored to be chronic. No intracranial hemorrhage, intracranial mass effect, or definite acute cytotoxic edema. 2. This was discussed by telephone with Dr. Wilford Corner on Oct 13, 2017 at 1055 hours. Electronically Signed   By: Odessa Fleming M.D.   On: 10-13-2017 10:59   TTE - Normal LV systolic function with grade III diastolic dysfunction.   No ASD. LA severely dilated. Moderate MR, mild-moderate TR.   Trivial AR with calcified and thickened leaflets.  LE venous doppler  Right: Portions of this examination were limited- see technologist comments above. There is no evidence of deep vein thrombosis in the lower extremity. However, portions of this examination were limited- see technologist comments above. No  cystic  structure found in the popliteal fossa. Left: Portions of this examination were limited- see technologist comments above. There is no evidence of deep vein thrombosis in the lower extremity. However, portions of this examination were limited- see technologist comments  above. non vascularized  hetergeneous area noted in the popliteal fossa, etiology unknown.   PHYSICAL EXAM  Temp:  [97.1 F (36.2 C)-99.5 F (37.5 C)] 99.5 F (37.5 C) (07/18 0532) Pulse Rate:  [70-113] 113 (07/18 0532) Resp:  [12-22] 18 (07/18 0532) BP: (98-157)/(69-76) 98/76 (07/18 0532) SpO2:  [87 %-100 %] 91 % (07/17 1830)  General - Well nourished, well developed, in coma.  Ophthalmologic - fundi not visualized due to noncooperation.  Cardiovascular - Regular rate and rhythm.  Neuro - in coma, not open eyes on voice, not following any commands. Exam limited due to comfort care status. Spontaneous breathing and no apnea or dyspnea. Limbs no spontaneous movement.    ASSESSMENT/PLAN Ms. Climmie Cronce is a 84 y.o. female with history of HTN, anxiety, breast cancer survivor admitted for AMS and left sided weakness. No tPA given due to OSW and possible seizure activity. Underwent IR with TICI2b revascularization.     Stroke:  bilateral infarcts with large right MCA and moderate left MCA and PCA infarcts, embolic pattern, source unclear  Resultant intubated, left hemiplegia  MRI  bilateral infarcts with large right MCA and moderate left MCA and PCA infarcts, old bilateral frontal infarcts, R larger than L  CTA head and neck - right ICA and MCA occlusion, right PCOM occlusion  DSA - RT ICA cavernous to terminus occlusion , and of the dominant RT PCOM supplying both PCAs - s/p TICI2b reperfusion  Repeat CT showed large right MCA infarcts and left occipital infarcts  MRI and MRA cancelled as request by family  2D Echo unremarkable  LE venous doppler no DVT  LDL 85  HgbA1c 5.0  NPO   On comfort care measures now - prepare for residential hospice  Right ICA and MCA occlusion  Etiology unclear  CTA head and neck - right ICA and MCA occlusion, right PCOM occlusion  DSA - RT ICA cavernous to terminus occlusion , and of the dominant RT PCOM supplying both PCAs - s/p  TICI2b reperfusion  Hyperlipidemia  Home meds:  none   LDL 85, goal < 70  Other Stroke Risk Factors  Advanced age  Hx stroke/TIA - on images  Other Active Problems  Anxiety  Breast cancer survivor    Hospital day # 3   Marvel Plan, MD PhD Stroke Neurology 10/10/2017 12:01 PM    To contact Stroke Continuity provider, please refer to [email or web address]. After hours, contact General Neurology

## 2017-10-22 NOTE — Progress Notes (Signed)
Mrs. Kathryn Johnson is sedated on 4 mg of morphine per hour this morning and is not at all interactive with me.  He has an occasional gurgling respiration but is in no overt distress.  Family is not present at the time of my visit.  Currently there is no indication of discomfort and it appears we are providing adequate palliation.  The critical care service will not routinely follow, please reconsult us if needed

## 2017-10-22 DEATH — deceased

## 2019-07-12 IMAGING — CT CT HEAD W/O CM
3 of 6 series · 14 of 47 positions shown, 17 images · non-contrast
Comparison: 10/05/2017

CLINICAL DATA: Altered level of consciousness

EXAM:
CT HEAD WITHOUT CONTRAST
TECHNIQUE: Contiguous axial images were obtained from the base of the skull
through the vertex without intravenous contrast.

[Series 3: head 5.0 h30s · axial · 0.45mm/px · z∈[-130,+20]mm · 9 of 34 slices shown, 12 images]
[im 2/34  brain]
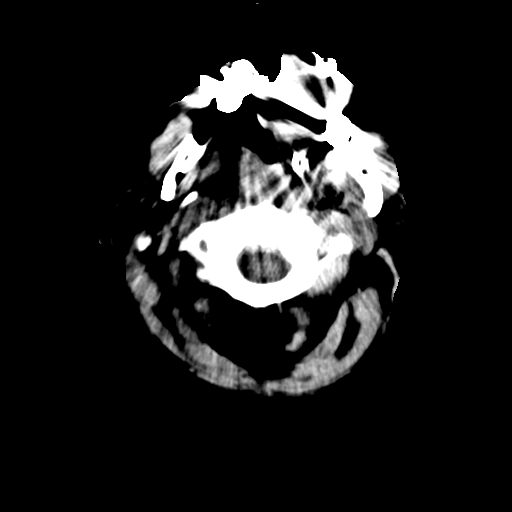
[im 2/34  bone]
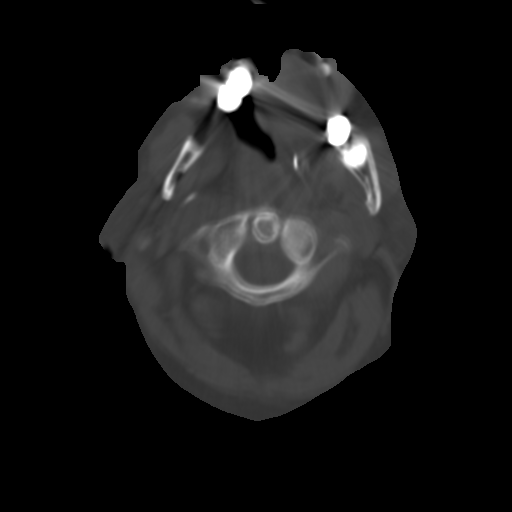
[im 6/34  brain]
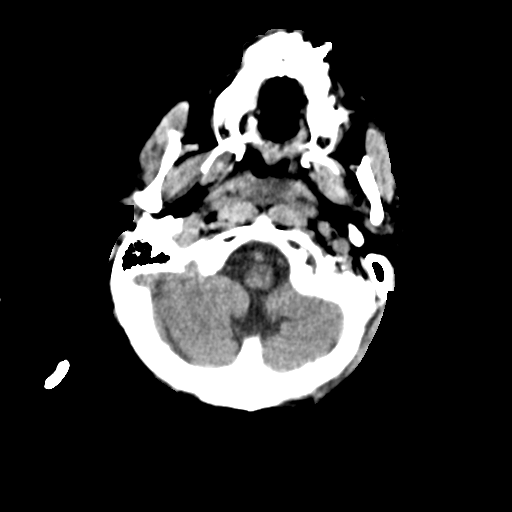
[im 10/34  brain]
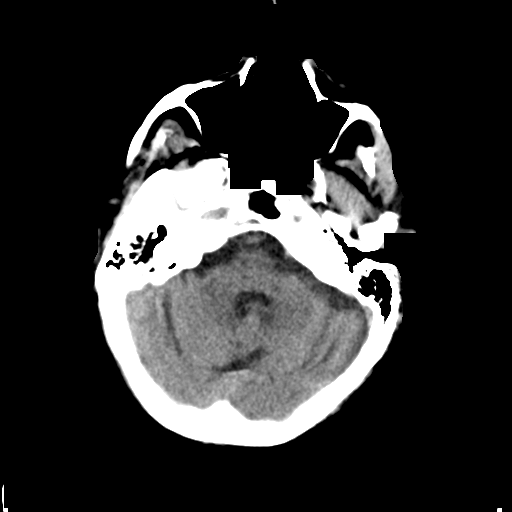
[im 14/34  brain]
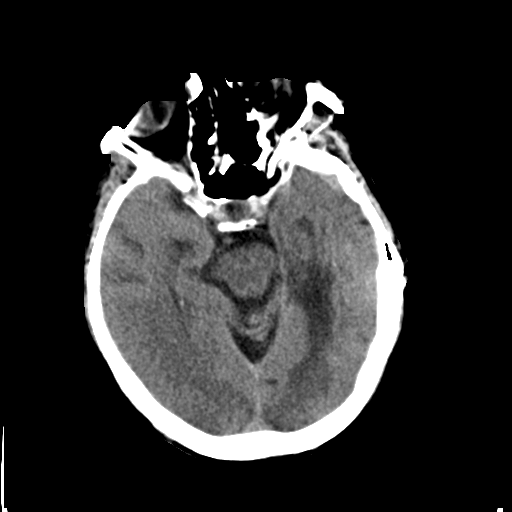
[im 18/34  brain]
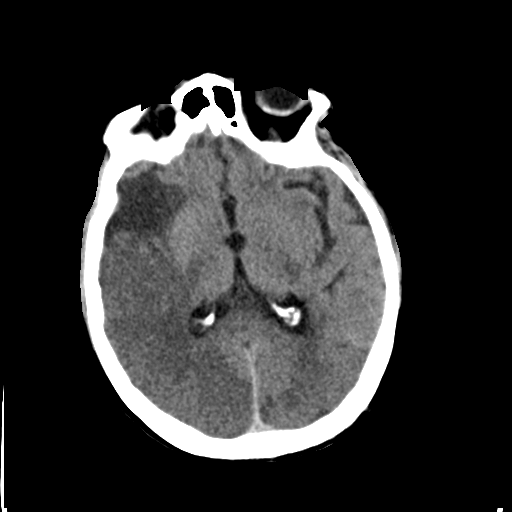
[im 18/34  bone]
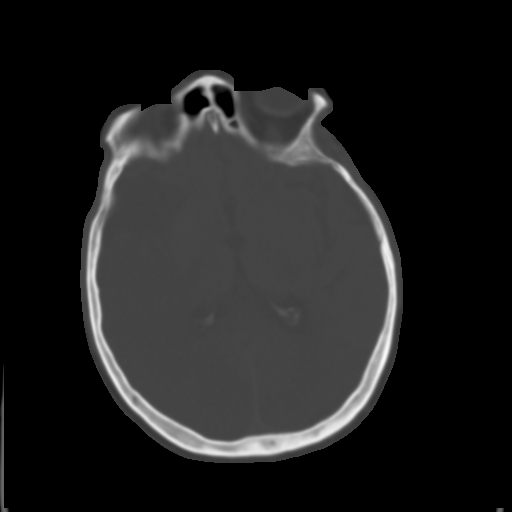
[im 20/34  brain]
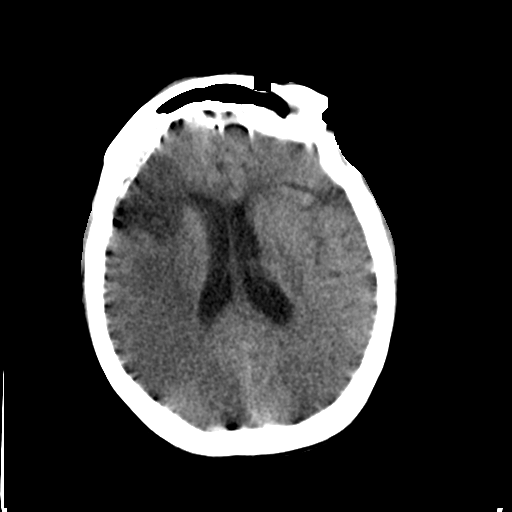
[im 24/34  brain]
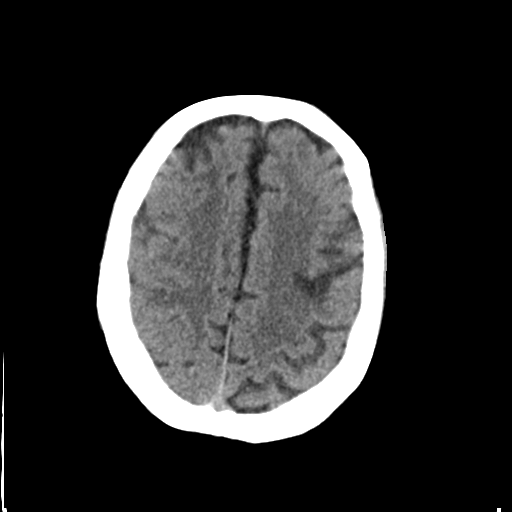
[im 28/34  brain]
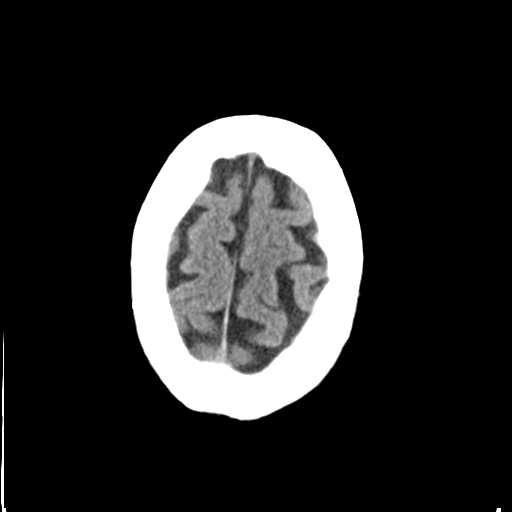
[im 32/34  brain]
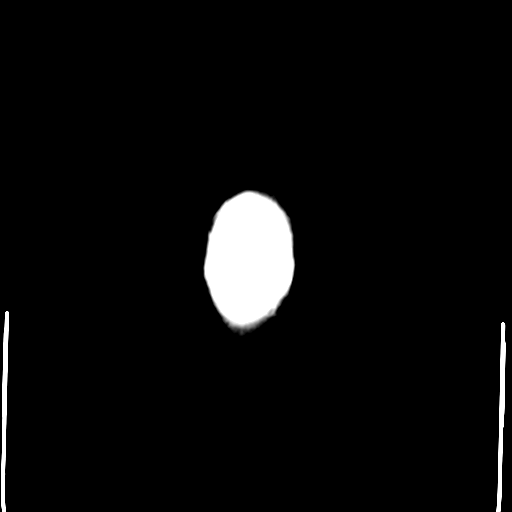
[im 32/34  bone]
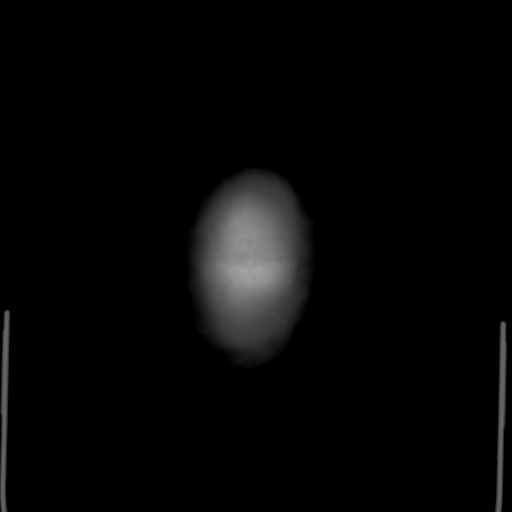

[Series 5: head 3.0 mpr cor · coronal · 0.33mm/px · 3 of 70 slices shown]
[im 19/70  brain]
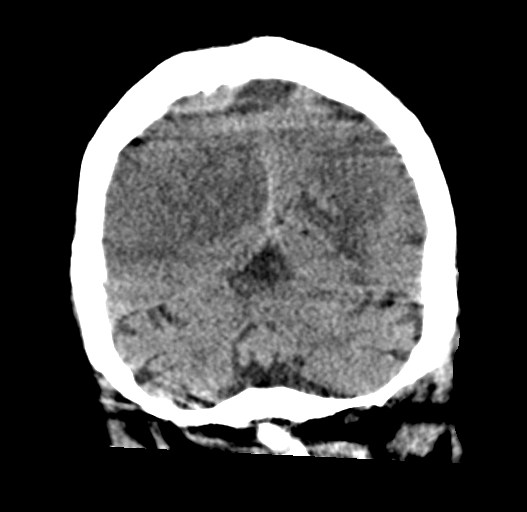
[im 35/70  brain]
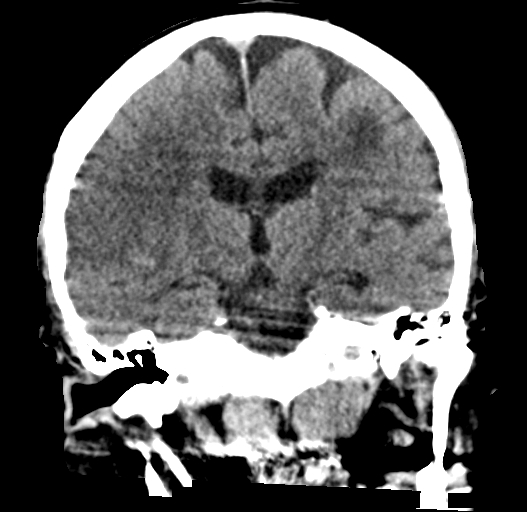
[im 51/70  brain]
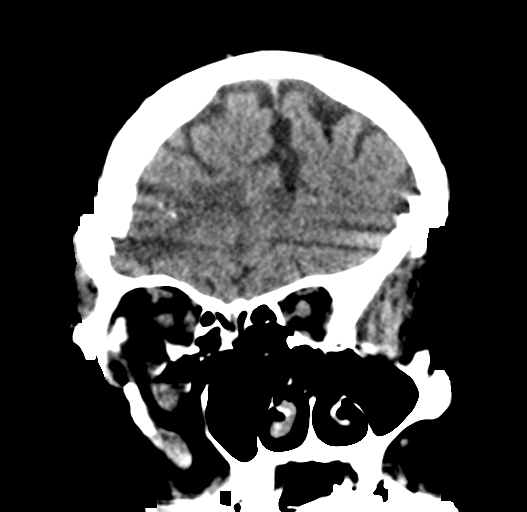

[Series 10: head 3.0 mpr sag · sagittal · 0.22mm/px · 2 of 59 slices shown]
[im 20/59  brain]
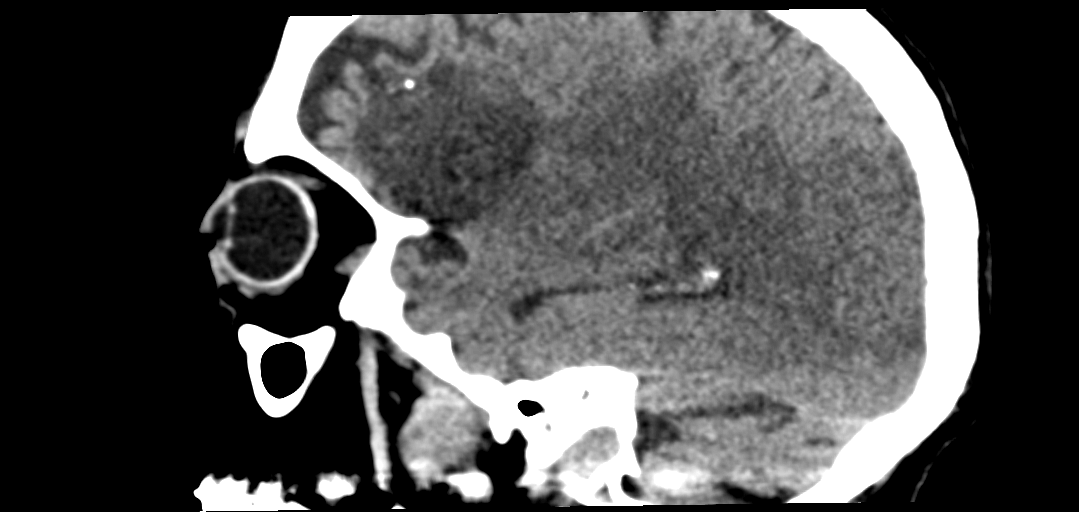
[im 39/59  brain]
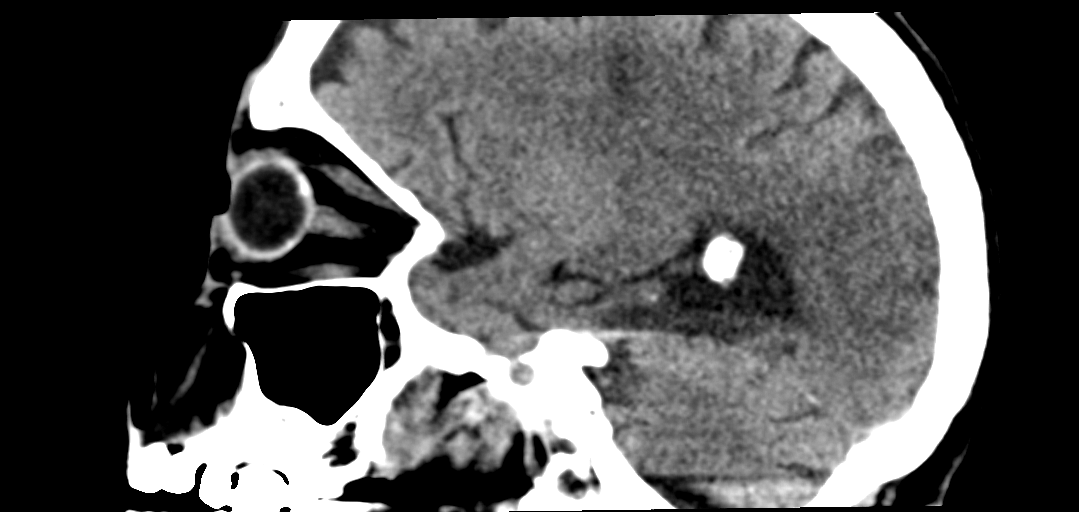

[14 of 47 positions shown; findings below may reference images not displayed]

FINDINGS: Brain: Motion artifact degrades image quality. There is new diffuse
low-density noted throughout much of the right cerebral hemisphere,
most notable in the temporal, parietal and occipital lobes. New
low-density also noted posteriorly in the left occipital lobe and
posterior parietal lobe. Findings are compatible with acute
infarctions. Old infarcts noted in the right frontotemporal lobe
with associated parenchymal calcifications and in the left posterior
frontal lobe. No hydrocephalus or hemorrhage.

Vascular: No evidence of aneurysm or adenopathy.

Skull: No acute calvarial abnormality.

Sinuses/Orbits: Visualized paranasal sinuses and mastoids clear.
Orbital soft tissues unremarkable.

Other: None
IMPRESSION: New large acute infarcts involving much of the mid and posterior
right cerebral hemisphere as well as the left occipital lobe.

Old bilateral frontal infarcts, right larger than left.

No hemorrhage.
